# Patient Record
Sex: Male | Born: 1980
Health system: Southern US, Community
[De-identification: ages and names within clinical notes are randomized; demographics above are authoritative.]

## PROBLEM LIST (undated history)

## (undated) DIAGNOSIS — D860 Sarcoidosis of lung: Secondary | ICD-10-CM

## (undated) DIAGNOSIS — N200 Calculus of kidney: Secondary | ICD-10-CM

## (undated) HISTORY — PX: LIVER BIOPSY: SHX301

---

## 2013-10-18 DIAGNOSIS — Z0000 Encounter for general adult medical examination without abnormal findings: Secondary | ICD-10-CM

## 2013-10-18 HISTORY — DX: Encounter for general adult medical examination without abnormal findings: Z00.00

## 2013-11-07 DIAGNOSIS — D869 Sarcoidosis, unspecified: Secondary | ICD-10-CM

## 2013-11-07 DIAGNOSIS — R350 Frequency of micturition: Secondary | ICD-10-CM

## 2013-11-07 HISTORY — DX: Frequency of micturition: R35.0

## 2013-11-07 HISTORY — DX: Sarcoidosis, unspecified: D86.9

## 2014-10-21 DIAGNOSIS — L989 Disorder of the skin and subcutaneous tissue, unspecified: Secondary | ICD-10-CM

## 2014-10-21 DIAGNOSIS — F419 Anxiety disorder, unspecified: Secondary | ICD-10-CM | POA: Insufficient documentation

## 2014-10-21 HISTORY — DX: Disorder of the skin and subcutaneous tissue, unspecified: L98.9

## 2014-10-21 HISTORY — DX: Anxiety disorder, unspecified: F41.9

## 2015-04-29 DIAGNOSIS — D869 Sarcoidosis, unspecified: Secondary | ICD-10-CM | POA: Diagnosis not present

## 2015-04-29 DIAGNOSIS — Z7951 Long term (current) use of inhaled steroids: Secondary | ICD-10-CM | POA: Diagnosis not present

## 2015-04-29 DIAGNOSIS — Z6828 Body mass index (BMI) 28.0-28.9, adult: Secondary | ICD-10-CM | POA: Diagnosis not present

## 2015-05-15 DIAGNOSIS — K7689 Other specified diseases of liver: Secondary | ICD-10-CM | POA: Diagnosis not present

## 2015-05-15 DIAGNOSIS — N281 Cyst of kidney, acquired: Secondary | ICD-10-CM | POA: Diagnosis not present

## 2015-05-15 DIAGNOSIS — D869 Sarcoidosis, unspecified: Secondary | ICD-10-CM | POA: Diagnosis not present

## 2015-09-10 DIAGNOSIS — Z5181 Encounter for therapeutic drug level monitoring: Secondary | ICD-10-CM | POA: Diagnosis not present

## 2015-09-10 DIAGNOSIS — D8689 Sarcoidosis of other sites: Secondary | ICD-10-CM | POA: Diagnosis not present

## 2015-09-10 DIAGNOSIS — D869 Sarcoidosis, unspecified: Secondary | ICD-10-CM | POA: Diagnosis not present

## 2015-09-10 DIAGNOSIS — M2550 Pain in unspecified joint: Secondary | ICD-10-CM | POA: Diagnosis not present

## 2015-09-10 DIAGNOSIS — Z6826 Body mass index (BMI) 26.0-26.9, adult: Secondary | ICD-10-CM | POA: Diagnosis not present

## 2015-09-10 DIAGNOSIS — Z8489 Family history of other specified conditions: Secondary | ICD-10-CM | POA: Diagnosis not present

## 2015-09-10 DIAGNOSIS — D860 Sarcoidosis of lung: Secondary | ICD-10-CM | POA: Diagnosis not present

## 2015-09-10 DIAGNOSIS — Z79899 Other long term (current) drug therapy: Secondary | ICD-10-CM | POA: Diagnosis not present

## 2015-12-17 DIAGNOSIS — Z6827 Body mass index (BMI) 27.0-27.9, adult: Secondary | ICD-10-CM | POA: Diagnosis not present

## 2015-12-17 DIAGNOSIS — D869 Sarcoidosis, unspecified: Secondary | ICD-10-CM | POA: Diagnosis not present

## 2015-12-17 DIAGNOSIS — K759 Inflammatory liver disease, unspecified: Secondary | ICD-10-CM | POA: Diagnosis not present

## 2015-12-21 DIAGNOSIS — D869 Sarcoidosis, unspecified: Secondary | ICD-10-CM | POA: Diagnosis not present

## 2015-12-21 DIAGNOSIS — Z6828 Body mass index (BMI) 28.0-28.9, adult: Secondary | ICD-10-CM | POA: Diagnosis not present

## 2015-12-21 DIAGNOSIS — Z23 Encounter for immunization: Secondary | ICD-10-CM | POA: Diagnosis not present

## 2015-12-21 DIAGNOSIS — M2550 Pain in unspecified joint: Secondary | ICD-10-CM | POA: Diagnosis not present

## 2015-12-21 DIAGNOSIS — F419 Anxiety disorder, unspecified: Secondary | ICD-10-CM | POA: Diagnosis not present

## 2015-12-21 DIAGNOSIS — R5383 Other fatigue: Secondary | ICD-10-CM | POA: Diagnosis not present

## 2015-12-21 DIAGNOSIS — D8689 Sarcoidosis of other sites: Secondary | ICD-10-CM | POA: Diagnosis not present

## 2015-12-29 DIAGNOSIS — R7989 Other specified abnormal findings of blood chemistry: Secondary | ICD-10-CM | POA: Diagnosis not present

## 2015-12-29 DIAGNOSIS — D869 Sarcoidosis, unspecified: Secondary | ICD-10-CM | POA: Diagnosis not present

## 2015-12-29 DIAGNOSIS — R740 Nonspecific elevation of levels of transaminase and lactic acid dehydrogenase [LDH]: Secondary | ICD-10-CM | POA: Diagnosis not present

## 2015-12-29 DIAGNOSIS — R748 Abnormal levels of other serum enzymes: Secondary | ICD-10-CM | POA: Diagnosis not present

## 2016-05-14 ENCOUNTER — Encounter (HOSPITAL_COMMUNITY): Payer: Self-pay | Admitting: Emergency Medicine

## 2016-05-14 ENCOUNTER — Ambulatory Visit (HOSPITAL_COMMUNITY)
Admission: EM | Admit: 2016-05-14 | Discharge: 2016-05-14 | Disposition: A | Discharge status: Home / Self Care | Payer: BLUE CROSS/BLUE SHIELD | Attending: Internal Medicine | Admitting: Internal Medicine

## 2016-05-14 ENCOUNTER — Emergency Department (HOSPITAL_COMMUNITY)
Admission: EM | Admit: 2016-05-14 | Discharge: 2016-05-14 | Disposition: A | Discharge status: Home / Self Care | Payer: BLUE CROSS/BLUE SHIELD | Attending: Emergency Medicine | Admitting: Emergency Medicine

## 2016-05-14 ENCOUNTER — Encounter (HOSPITAL_COMMUNITY): Payer: Self-pay

## 2016-05-14 DIAGNOSIS — N200 Calculus of kidney: Secondary | ICD-10-CM | POA: Insufficient documentation

## 2016-05-14 DIAGNOSIS — R109 Unspecified abdominal pain: Secondary | ICD-10-CM | POA: Diagnosis not present

## 2016-05-14 DIAGNOSIS — R1011 Right upper quadrant pain: Secondary | ICD-10-CM

## 2016-05-14 DIAGNOSIS — D869 Sarcoidosis, unspecified: Secondary | ICD-10-CM

## 2016-05-14 HISTORY — DX: Sarcoidosis of lung: D86.0

## 2016-05-14 LAB — COMPREHENSIVE METABOLIC PANEL
ALT: 48 U/L (ref 17–63)
ANION GAP: 5 (ref 5–15)
AST: 38 U/L (ref 15–41)
Albumin: 3.8 g/dL (ref 3.5–5.0)
Alkaline Phosphatase: 122 U/L (ref 38–126)
BUN: 22 mg/dL — ABNORMAL HIGH (ref 6–20)
CHLORIDE: 101 mmol/L (ref 101–111)
CO2: 29 mmol/L (ref 22–32)
Calcium: 9.2 mg/dL (ref 8.9–10.3)
Creatinine, Ser: 1.37 mg/dL — ABNORMAL HIGH (ref 0.61–1.24)
GFR calc non Af Amer: >60 mL/min (ref >60)
Glucose, Bld: 81 mg/dL (ref 65–99)
POTASSIUM: 3.7 mmol/L (ref 3.5–5.1)
Sodium: 135 mmol/L (ref 135–145)
Total Bilirubin: 1 mg/dL (ref 0.3–1.2)
Total Protein: 8.2 g/dL — ABNORMAL HIGH (ref 6.5–8.1)

## 2016-05-14 LAB — CBC
HEMATOCRIT: 35.4 % — AB (ref 39.0–52.0)
HEMOGLOBIN: 12.4 g/dL — AB (ref 13.0–17.0)
MCH: 30.3 pg (ref 26.0–34.0)
MCHC: 35 g/dL (ref 30.0–36.0)
MCV: 86.6 fL (ref 78.0–100.0)
Platelets: 231 10*3/uL (ref 150–400)
RBC: 4.09 MIL/uL — AB (ref 4.22–5.81)
RDW: 12.6 % (ref 11.5–15.5)
WBC: 5.6 10*3/uL (ref 4.0–10.5)

## 2016-05-14 LAB — URINALYSIS, ROUTINE W REFLEX MICROSCOPIC
BILIRUBIN URINE: NEGATIVE
GLUCOSE, UA: NEGATIVE mg/dL
Hgb urine dipstick: NEGATIVE
Ketones, ur: NEGATIVE mg/dL
LEUKOCYTES UA: NEGATIVE
NITRITE: NEGATIVE
PH: 5 (ref 5.0–8.0)
Protein, ur: NEGATIVE mg/dL
SPECIFIC GRAVITY, URINE: 1.024 (ref 1.005–1.030)

## 2016-05-14 LAB — LIPASE, BLOOD: LIPASE: 30 U/L (ref 11–51)

## 2016-05-14 MED ORDER — HYDROCODONE-ACETAMINOPHEN 5-325 MG PO TABS: 1.0000 | ORAL_TABLET | Freq: Four times a day (QID) | ORAL | 0 refills | Status: DC | PRN

## 2016-05-14 MED ORDER — ONDANSETRON 4 MG PO TBDP: 4.0000 mg | ORAL_TABLET | Freq: Three times a day (TID) | ORAL | 0 refills | Status: DC | PRN

## 2016-05-14 NOTE — ED Provider Notes (Signed)
CSN: [id number]     Arrival date & time 05/14/16  1225 History   First MD Initiated Contact with Patient 05/14/16 1328     Chief Complaint  Patient presents with  . Abdominal Pain   (Consider location/radiation/quality/duration/timing/severity/associated sxs/prior Treatment) 36 year old male with a diagnosis sarcoidosis presents to the urgent care with a 3 day history of abdominal pain. He places his hand to the upper middle and right upper quadrant. He also states the pain radiates to the right lateral abdomen. He states it was relatively severe at the outset but a little better today. He has a decreased appetite. Denies nausea vomiting or diarrhea. Denies fever. He called his PCP today and her advise was to go to emergency department for evaluation that may include blood test and imaging.      History reviewed. No pertinent past medical history. History reviewed. No pertinent surgical history. History reviewed. No pertinent family history. Social History  Substance Use Topics  . Smoking status: Never Smoker  . Smokeless tobacco: Never Used  . Alcohol use No    Review of Systems  Constitutional: Positive for activity change. Negative for chills and fever.  Respiratory: Negative.   Cardiovascular: Negative.   Gastrointestinal: Positive for abdominal pain and diarrhea. Negative for blood in stool, constipation and vomiting.  Genitourinary: Negative.   Musculoskeletal: Negative.   Neurological: Negative.   All other systems reviewed and are negative.   Allergies  Patient has no known allergies.  Home Medications   Prior to Admission medications   Medication Sig Start Date End Date Taking? Authorizing Provider  Folic Acid 5 MG CAPS Take by mouth.   Yes [provider]  METHOTREXATE PO Take by mouth.   Yes [provider]   Meds Ordered and Administered this Visit  Medications - No data to display  BP 102/60 (BP Location: Right Arm)   Pulse 60   Temp  98.3 F (36.8 C) (Oral)   Resp 14   SpO2 98%  No data found.   Physical Exam  Constitutional: He is oriented to person, place, and time. He appears well-developed and well-nourished. No distress.  Eyes: EOM are normal.  Neck: Neck supple.  Cardiovascular: Normal rate, regular rhythm, normal heart sounds and intact distal pulses.   Pulmonary/Chest: Effort normal and breath sounds normal. No respiratory distress. He has no wheezes. He has no rales.  Abdominal: Soft. Bowel sounds are normal. There is tenderness. There is guarding. There is no rebound.  Epigastrium and upper abdomen with tympany. There is tenderness over the right upper quadrant with palpation of liver/GB with deep breath exacerbating tenderness. No tenderness across the lower abdomen. When legs are straight out there is a palpable nontender mass vertically from the left upper quadrant to the left lower quadrant just lateral to the umbilicus. No right lower quadrant tenderness or rebound.  Musculoskeletal: He exhibits no edema.  Neurological: He is alert and oriented to person, place, and time.  Skin: Skin is warm and dry.  Psychiatric: He has a normal mood and affect. His behavior is normal. Thought content normal.  Nursing note and vitals reviewed.   Urgent Care Course     Procedures (including critical care time)  Labs Review Labs Reviewed - No data to display  Imaging Review No results found.   Visual Acuity Review  Right Eye Distance:   Left Eye Distance:   Bilateral Distance:    Right Eye Near:   Left Eye Near:    Bilateral  Near:         MDM   1. Right upper quadrant abdominal pain   2. Acute right flank pain   3. Sarcoidosis    Go to the emergency department now for evaluation as recommended by your primary care provider. Unfortunately the types of lab work and imaging that you may need for evaluation today is not available at the urgent care.     Hayden Rasmussen, NP 05/14/16 1353    Hayden Rasmussen, NP 05/14/16 1354    Hayden Rasmussen, NP 05/14/16 1359

## 2016-05-14 NOTE — Discharge Instructions (Signed)
As discussed, please drink plenty of fluids to keep your urine clear. Nausea and pain medication as needed. Follow up with a primary care provider.  Return to the emergency department if you experience difficulty urinating, increased pain, fever, chills, nausea, vomiting or any other new concerning symptoms.

## 2016-05-14 NOTE — ED Triage Notes (Signed)
Here for abd pain onset 3 days associated w/back pain, decreased appetite, fatigue  Hx of sarcoidosis   Denies urinary sx, n/v/d, fevers, pain at the moment.   A&O x4... NAD

## 2016-05-14 NOTE — ED Triage Notes (Signed)
Patient complains of right sided abdominal pain with radiation to flank intermittent x 3 days. States that he has sarcoidosis and recently had biopsy and liver enzymes were elevated. Here for further eval. No nausea, no vomiting, no diarrhea

## 2016-05-14 NOTE — ED Provider Notes (Signed)
[facility]-EMERGENCY DEPT Provider Note   CSN: [id number] Arrival date & time: 05/14/16  1411     History   Chief Complaint Chief Complaint  Patient presents with  . Abdominal Pain    HPI Jeffery Yates is a 36 y.o. male with a history of sarcoidosis presenting with 3 days of intermittent non-radiating sharp right flank pain which was initially worse and now is less in intensity. He denies any pain on my assessment. He has had a little bit of nausea with the intense pain initially but not at this time. He has not noted any alleviating or worsening factors. Has not tried anything for the pain. Denies vomiting, diarrhea, fever, chills, radiation of the pain down in his groin, no urinary symptoms, no testicular pain or swelling. He does note a history of kidney stones and reports that the symptoms are similar.  HPI  Past Medical History:  Diagnosis Date  . Sarcoidosis, lung (HCC)     There are no active problems to display for this patient.   History reviewed. No pertinent surgical history.     Home Medications    Prior to Admission medications   Medication Sig Start Date End Date Taking? Authorizing Provider  folic acid (FOLVITE) 1 MG tablet Take 1 mg by mouth daily. 04/05/16  Yes [provider]  TREXALL 15 MG tablet Take 15 mg by mouth every 7 (seven) days. 04/21/16  Yes [provider]  HYDROcodone-acetaminophen (NORCO/VICODIN) 5-325 MG tablet Take 1 tablet by mouth every 6 (six) hours as needed. 05/14/16   Mathews Robinsons B, PA-C  ondansetron (ZOFRAN ODT) 4 MG disintegrating tablet Take 1 tablet (4 mg total) by mouth every 8 (eight) hours as needed for nausea or vomiting. 05/14/16   Georgiana Shore, PA-C    Family History No family history on file.  Social History Social History  Substance Use Topics  . Smoking status: Never Smoker  . Smokeless tobacco: Never Used  . Alcohol use No     Allergies   Patient has no known allergies.   Review of  Systems Review of Systems  Constitutional: Negative for chills and fever.  HENT: Negative for ear pain and sore throat.   Eyes: Negative for pain and visual disturbance.  Respiratory: Negative for cough, shortness of breath, wheezing and stridor.   Cardiovascular: Negative for chest pain, palpitations and leg swelling.  Gastrointestinal: Positive for nausea. Negative for abdominal distention, abdominal pain, diarrhea and vomiting.  Genitourinary: Positive for flank pain. Negative for decreased urine volume, difficulty urinating, dysuria, frequency, hematuria, scrotal swelling, testicular pain and urgency.  Musculoskeletal: Negative for arthralgias, back pain, myalgias, neck pain and neck stiffness.  Skin: Negative for color change, pallor and rash.  Neurological: Negative for seizures, syncope and weakness.     Physical Exam Updated Vital Signs BP 128/72   Pulse (!) 58   Temp 97.5 F (36.4 C) (Oral)   Resp 17   Ht 5\' 8"  (1.727 m)   Wt 85.5 kg   SpO2 100%   BMI 28.65 kg/m   Physical Exam  Constitutional: He appears well-developed and well-nourished. No distress.  Patient is afebrile, nontoxic-appearing, lying comfortably in bed in no acute distress.  HENT:  Head: Normocephalic and atraumatic.  Eyes: Conjunctivae and EOM are normal. Right eye exhibits no discharge. Left eye exhibits no discharge. No scleral icterus.  Neck: Normal range of motion. Neck supple.  Cardiovascular: Normal rate, regular rhythm and normal heart sounds.   No murmur heard. Pulmonary/Chest:  Effort normal and breath sounds normal. No respiratory distress. He has no wheezes. He has no rales. He exhibits no tenderness.  Abdominal: Soft. Bowel sounds are normal. He exhibits no distension and no mass. There is no tenderness. There is no guarding.  Patient has no tenderness palpation of the abdomen in the area of discomfort as nonreproducible to palpation. Negative Murphy sign, negative McBurney's point  tenderness, negative rebound, negative periumbilical tenderness, abdomen is soft and nontender.  Musculoskeletal: Normal range of motion. He exhibits no edema.  Neurological: He is alert.  Skin: Skin is warm and dry. He is not diaphoretic.  Psychiatric: He has a normal mood and affect.  Nursing note and vitals reviewed.    ED Treatments / Results  Labs (all labs ordered are listed, but only abnormal results are displayed) Labs Reviewed  COMPREHENSIVE METABOLIC PANEL - Abnormal; Notable for the following:       Result Value   BUN 22 (*)    Creatinine, Ser 1.37 (*)    Total Protein 8.2 (*)    All other components within normal limits  CBC - Abnormal; Notable for the following:    RBC 4.09 (*)    Hemoglobin 12.4 (*)    HCT 35.4 (*)    All other components within normal limits  LIPASE, BLOOD  URINALYSIS, ROUTINE W REFLEX MICROSCOPIC    EKG  EKG Interpretation None       Radiology No results found.  Procedures Procedures (including critical care time)  Medications Ordered in ED Medications - No data to display   Initial Impression / Assessment and Plan / ED Course  I have reviewed the triage vital signs and the nursing notes.  Pertinent labs & imaging results that were available during my care of the patient were reviewed by me and considered in my medical decision making (see chart for details).     Patient presents with intermittent right flank pain which has been decreasing in intensity over the last couple days. He declines anything for pain at this time. No tenderness palpation on my exam. He is afebrile nontoxic appearing.  Patient with history of kidney stone and reports that the symptoms are similar. His records show a CT renal few years ago with a right nonobstructing stone. Exam is otherwise reassuring labs are unremarkable other than the slightly elevated creatinine. Past record show creatinine around 1.22 and is now 1.37. Negative urine. Liver functions  are normal.  Discussed with patient clinical suspicion of kidney stone. Use joint decision making in ordering CT scan today.  Will treat as kidney stone with return precautions and close follow-up.   We'll discharge home with pain management and close follow-up with PCP or urology.   Discussed strict return precautions and advised to return to the emergency department if experiencing any new or worsening symptoms. Instructions were understood and patient agreed with discharge plan.Patient was discussed with Dr. Effie ShyWentz who agrees with assessment and plan.  Final Clinical Impressions(s) / ED Diagnoses   Final diagnoses:  Flank pain, acute  Nephrolithiasis    New Prescriptions Discharge Medication List as of 05/14/2016  5:14 PM    START taking these medications   Details  HYDROcodone-acetaminophen (NORCO/VICODIN) 5-325 MG tablet Take 1 tablet by mouth every 6 (six) hours as needed., Starting Sat 05/14/2016, Print    ondansetron (ZOFRAN ODT) 4 MG disintegrating tablet Take 1 tablet (4 mg total) by mouth every 8 (eight) hours as needed for nausea or vomiting., Starting Sat 05/14/2016, Print  Georgiana Shore, PA-C 05/14/16 1843    Mancel Bale, MD 05/18/16 [phone]

## 2016-05-14 NOTE — Discharge Instructions (Signed)
Go to the emergency department now for evaluation as recommended by your primary care provider. Unfortunately the types of lab work and imaging that you may need for evaluation today is not available at the urgent care.

## 2016-05-14 NOTE — ED Notes (Signed)
Patient given fluids 

## 2016-06-20 DIAGNOSIS — F419 Anxiety disorder, unspecified: Secondary | ICD-10-CM | POA: Diagnosis not present

## 2016-06-20 DIAGNOSIS — D860 Sarcoidosis of lung: Secondary | ICD-10-CM | POA: Diagnosis not present

## 2016-06-20 DIAGNOSIS — Z79899 Other long term (current) drug therapy: Secondary | ICD-10-CM | POA: Diagnosis not present

## 2016-06-20 DIAGNOSIS — M2550 Pain in unspecified joint: Secondary | ICD-10-CM | POA: Diagnosis not present

## 2016-06-20 DIAGNOSIS — R05 Cough: Secondary | ICD-10-CM | POA: Diagnosis not present

## 2016-06-20 DIAGNOSIS — Z6828 Body mass index (BMI) 28.0-28.9, adult: Secondary | ICD-10-CM | POA: Diagnosis not present

## 2016-06-20 DIAGNOSIS — D869 Sarcoidosis, unspecified: Secondary | ICD-10-CM | POA: Diagnosis not present

## 2016-06-20 DIAGNOSIS — R5383 Other fatigue: Secondary | ICD-10-CM | POA: Diagnosis not present

## 2016-08-11 ENCOUNTER — Encounter: Payer: Self-pay | Admitting: Internal Medicine

## 2016-08-11 ENCOUNTER — Ambulatory Visit (INDEPENDENT_AMBULATORY_CARE_PROVIDER_SITE_OTHER): Payer: BLUE CROSS/BLUE SHIELD | Admitting: Internal Medicine

## 2016-08-11 ENCOUNTER — Ambulatory Visit (INDEPENDENT_AMBULATORY_CARE_PROVIDER_SITE_OTHER)
Admission: RE | Admit: 2016-08-11 | Discharge: 2016-08-11 | Disposition: A | Discharge status: Home / Self Care | Payer: BLUE CROSS/BLUE SHIELD | Source: Ambulatory Visit | Attending: Internal Medicine | Admitting: Internal Medicine

## 2016-08-11 VITALS — BP 106/70 | HR 55 | Ht 68.0 in | Wt 187.0 lb

## 2016-08-11 DIAGNOSIS — D869 Sarcoidosis, unspecified: Secondary | ICD-10-CM

## 2016-08-11 DIAGNOSIS — J849 Interstitial pulmonary disease, unspecified: Secondary | ICD-10-CM | POA: Diagnosis not present

## 2016-08-11 NOTE — Progress Notes (Signed)
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 Pulmonary Consult    Referred by Dr. Okey Regalrummond. Pt here to est care for his Sarcoid. He was dxed with Sarcoid in 2003. He states his main problem is fatigue. He has a cough every am with clear to yellow sputum. He states several wks ago he had a cough that was keeping him awake but this went away.   uses saba and a lot better p saba but still poor ex tol = MMRC1 = can walk nl pace, flat grade, can't hurry or go uphills or steps s sob    No obvious day to day or daytime variability or presently  assoc excess/ purulent sputum or mucus plugs or hemoptysis or cp or chest tightness, subjective wheeze or overt sinus or hb symptoms. No unusual exp hx or h/o childhood pna/ asthma or knowledge of premature birth.  Sleeping ok without nocturnal   exacerbation  of respiratory  c/o's or need for noct saba. Also denies any obvious fluctuation of symptoms with weather or environmental changes or other aggravating or alleviating factors except as outlined above   Current Medications, Allergies, Complete Past Medical History, Past Surgical History, Family History, and Social History were reviewed in [facility] electronic medical record.  ROS  The following are not active complaints unless bolded sore throat, dysphagia, dental problems,  itching, sneezing,  nasal congestion or excess/ purulent secretions, ear ache,   fever, chills, sweats, unintended wt loss, classically pleuritic or exertional cp,  orthopnea pnd or leg swelling, presyncope, palpitations, abdominal pain, anorexia, nausea, vomiting, diarrhea  or change in bowel or bladder habits, change in stools or urine, dysuria,hematuria,  rash, arthralgias, visual complaints, headache, numbness, weakness or ataxia or problems with walking or coordination,  change in mood/affect or memory.           Review of Systems     Objective:   Physical Exam    amb bm nad    Wt Readings from Last 3 Encounters:  08/11/16 187 lb (84.8 kg)  05/14/16 188 lb 6.4 oz (85.5 kg)    Vital signs reviewed - Note on arrival 02 sats  98% on RA      HEENT: nl dentition, turbinates bilaterally, and oropharynx. Nl external ear canals without cough reflex   NECK :  without JVD/Nodes/TM/ nl carotid upstrokes bilaterally   LUNGS: no acc muscle use,  Nl contour chest which is clear to A and P bilaterally without cough on insp or exp maneuvers   CV:  RRR  no s3 or murmur or increase in P2, and no edema   ABD:  soft and nontender with nl inspiratory excursion in the supine position. No bruits or organomegaly appreciated, bowel sounds nl  MS:  Nl gait/ ext  warm without deformities, calf tenderness, cyanosis or clubbing No obvious joint restrictions   SKIN: warm and dry without lesions    NEURO:  alert, approp, nl sensorium with  no motor or cerebellar deficits apparent.      CXR PA and Lateral:   08/11/2016 :    I personally reviewed images and agree with radiology impression as follows:    Bilateral interstitial prominence noted consistent chronic interstitial lung disease.   Labs   reviewed:      Chemistry      Component Value Date/Time   NA 135 05/14/2016 1420   K 3.7 05/14/2016 1420   CL 101 05/14/2016 1420   CO2 29 05/14/2016 1420   BUN 22 (H) 05/14/2016 1420    CREATININE 1.37 (H) 05/14/2016 1420      Component Value Date/Time   CALCIUM 9.2 05/14/2016 1420   ALKPHOS 122 05/14/2016 1420   AST 38 05/14/2016 1420   ALT 48 05/14/2016 1420   BILITOT 1.0 05/14/2016 1420        Lab Results  Component Value Date   WBC 5.6 05/14/2016   HGB 12.4 (L) 05/14/2016   HCT 35.4 (L) 05/14/2016   MCV 86.6 05/14/2016   PLT 231 05/14/2016            Assessment:

## 2016-08-11 NOTE — Patient Instructions (Addendum)
Please remember to go to the  x-ray department downstairs in the basement  for your tests - we will call you with the results when they are available.  To get the most out of exercise, you need to be continuously aware that you are short of breath, but never out of breath, for 30 minutes daily. As you improve, it will actually be easier for you to do the same amount of exercise  in  30 minutes so always push to the level where you are short of breath.   Please see patient coordinator before you leave today  to schedule ophthalmology eval     Please schedule a follow up office visit in 6 weeks, call sooner if needed with pfts on return

## 2016-08-11 NOTE — Progress Notes (Signed)
Called and left detailed msg with results

## 2016-08-11 NOTE — Assessment & Plan Note (Addendum)
Dx 2003 [place] dx by [facility]OB intolerant to high dose steroids/ main symptoms = fatigue/ doe/ min cough  - 04/25/13  FVC 4.16 = 101%  - 04/30/15  FVC 4.2 = 107% - Spirometry 05/24/16   FEV1 1.15  (40%)  Ratio 73 with non-physiologic f/v loop  With FVC 1.58   - 08/11/2016 1st [facility] on MTX 15 mg per week and referred to opth in [place]    Not really certain all his symptoms can be attributed to 15 year hx of steroid /mtx dependent inflammation and may need to get more serial objective data to justify this aggressive of rx   For now no change in rx needed but needs to start more regular aerobic activity and continue mtx and return to clinic in 6 weeks with full pfts and repeat lfts/ esr / angiotensin levels then   Total time devoted to counseling  > 50 % of initial 60 min office visit:  review case(and extensive [facility] records)  with pt/ discussion of options/alternatives/ personally creating written customized instructions  in presence of pt  then going over those specific  Instructions directly with the pt including how to use all of the meds but in particular covering each new medication in detail and the difference between the maintenance= "automatic" meds and the prns using an action plan format for the latter (If this problem/symptom => do that organization reading Left to right).  Please see AVS from this visit for a full list of these instructions which I personally wrote for this pt and  are unique to this visit.

## 2016-08-18 DIAGNOSIS — D869 Sarcoidosis, unspecified: Secondary | ICD-10-CM | POA: Diagnosis not present

## 2016-08-18 DIAGNOSIS — H04123 Dry eye syndrome of bilateral lacrimal glands: Secondary | ICD-10-CM | POA: Diagnosis not present

## 2016-10-02 DIAGNOSIS — R000 Tachycardia, unspecified: Secondary | ICD-10-CM | POA: Diagnosis not present

## 2016-10-02 DIAGNOSIS — Z8679 Personal history of other diseases of the circulatory system: Secondary | ICD-10-CM | POA: Diagnosis not present

## 2016-10-02 DIAGNOSIS — R0789 Other chest pain: Secondary | ICD-10-CM | POA: Diagnosis not present

## 2016-10-02 DIAGNOSIS — I447 Left bundle-branch block, unspecified: Secondary | ICD-10-CM | POA: Diagnosis not present

## 2016-10-05 ENCOUNTER — Other Ambulatory Visit: Payer: Self-pay | Admitting: Internal Medicine

## 2016-10-05 DIAGNOSIS — R0600 Dyspnea, unspecified: Secondary | ICD-10-CM

## 2016-10-06 ENCOUNTER — Ambulatory Visit: Payer: BLUE CROSS/BLUE SHIELD | Admitting: Internal Medicine

## 2016-11-12 ENCOUNTER — Emergency Department (HOSPITAL_COMMUNITY)
Admission: EM | Admit: 2016-11-12 | Discharge: 2016-11-13 | Disposition: A | Discharge status: Home / Self Care | Payer: BLUE CROSS/BLUE SHIELD | Attending: Emergency Medicine | Admitting: Emergency Medicine

## 2016-11-12 ENCOUNTER — Encounter (HOSPITAL_COMMUNITY): Payer: Self-pay

## 2016-11-12 DIAGNOSIS — R21 Rash and other nonspecific skin eruption: Secondary | ICD-10-CM | POA: Insufficient documentation

## 2016-11-12 DIAGNOSIS — Z79899 Other long term (current) drug therapy: Secondary | ICD-10-CM | POA: Diagnosis not present

## 2016-11-12 DIAGNOSIS — D860 Sarcoidosis of lung: Secondary | ICD-10-CM | POA: Diagnosis not present

## 2016-11-12 MED ORDER — PREDNISONE 20 MG PO TABS: 40.0000 mg | ORAL_TABLET | Freq: Every day | ORAL | 0 refills | Status: DC

## 2016-11-12 MED ORDER — FAMOTIDINE 20 MG PO TABS: 20.0000 mg | ORAL_TABLET | Freq: Two times a day (BID) | ORAL | 0 refills | Status: DC

## 2016-11-12 MED ORDER — PREDNISONE 20 MG PO TABS
60.0000 mg | ORAL_TABLET | Freq: Once | ORAL | Status: AC
  Administered 2016-11-13: 60 mg via ORAL
  Filled 2016-11-12: qty 3

## 2016-11-12 MED ORDER — FAMOTIDINE 20 MG PO TABS
20.0000 mg | ORAL_TABLET | Freq: Once | ORAL | Status: AC
  Administered 2016-11-13: 20 mg via ORAL
  Filled 2016-11-12: qty 1

## 2016-11-12 NOTE — ED Triage Notes (Signed)
Onset 2 nights ago widespread red raised rash, itches.  Rash will come and go.  Has been taking benadryl with some relief in itching.  No new foods, products, medications.  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  NO swallowing or respiratory difficulties.

## 2016-11-12 NOTE — Discharge Instructions (Signed)
Continue Benadryl, take every 6 hours.  Take prednisone as prescribed until all gone.  Take Pepcid daily to help with the reaction.  Follow-up with family doctor or dermatology.  Return if any swelling to the lips, tongue, oropharynx.

## 2016-11-12 NOTE — ED Provider Notes (Signed)
[facility] EMERGENCY DEPARTMENT Provider Note   CSN: [id number] Arrival date & time: 11/12/16  2110     History   Chief Complaint Chief Complaint  Patient presents with  . Rash    HPI Jeffery Yates is a 36 y.o. male.  HPI Jeffery Yates is a 36 y.o. male presents to emergency department complaining of rash.  Patient states he noticed rash 2 days ago.  Rash comes and goes.  States it is "all over my body."  Rash is erythematous, very itchy.  He states Benadryl helps the rash.  Last dose taken at 6 PM, and currently no rash other than some itching and bumps to the hands.  He denies any associated shortness of breath, swelling to the lips, tongue, oropharynx.  He denies any new products, specifically no body lotions, soaps, detergents.  He denies any new medications.  No new clothing.  He has not spent a night anywhere else within his house.  He states he did clean out old house before symptoms began.  No one in the household with similar rash.  Past Medical History:  Diagnosis Date  . Sarcoidosis, lung [facility])     Patient Active Problem List   Diagnosis Date Noted  . Sarcoidosis 08/11/2016    History reviewed. No pertinent surgical history.     Home Medications    Prior to Admission medications   Medication Sig Start Date End Date Taking? Authorizing Provider  folic acid (FOLVITE) 1 MG tablet Take 1 mg by mouth daily. 04/05/16   [provider]  TREXALL 15 MG tablet Take 15 mg by mouth every 7 (seven) days. 04/21/16   [provider]    Family History History reviewed. No pertinent family history.  Social History Social History  Substance Use Topics  . Smoking status: Never Smoker  . Smokeless tobacco: Never Used  . Alcohol use No     Allergies   Patient has no known allergies.   Review of Systems Review of Systems  Constitutional: Negative for chills and fever.  HENT: Negative for congestion, facial swelling, trouble  swallowing and voice change.   Respiratory: Negative for cough, chest tightness and shortness of breath.   Cardiovascular: Negative for chest pain, palpitations and leg swelling.  Gastrointestinal: Negative for abdominal distention, abdominal pain, diarrhea and nausea.  Musculoskeletal: Negative for arthralgias, myalgias, neck pain and neck stiffness.  Skin: Positive for rash.  Allergic/Immunologic: Negative for immunocompromised state.  Neurological: Negative for dizziness, weakness, light-headedness, numbness and headaches.  All other systems reviewed and are negative.    Physical Exam Updated Vital Signs BP 118/82 (BP Location: Left Arm)   Pulse 66   Temp 97.9 F (36.6 C) (Oral)   Resp 16   Ht 5\' 8"  (1.727 m)   Wt 83.9 kg (185 lb)   SpO2 100%   BMI 28.13 kg/m   Physical Exam  Constitutional: He appears well-developed and well-nourished. No distress.  HENT:  Head: Normocephalic and atraumatic.  Right Ear: External ear normal.  Left Ear: External ear normal.  Nose: Nose normal.  Mouth/Throat: Oropharynx is clear and moist.  Eyes: Conjunctivae are normal.  Neck: Neck supple.  Cardiovascular: Normal rate, regular rhythm and normal heart sounds.   Pulmonary/Chest: Effort normal. No respiratory distress. He has no wheezes. He has no rales.  Musculoskeletal: He exhibits no edema.  Neurological: He is alert.  Skin: Skin is warm and dry.  Mild erythema in patches to bilateral hands and fingers.  No vesicles or papules.  Small hives to bilateral groin.  No rash anywhere else to the body.  No rash of oral mucosa  Nursing note and vitals reviewed.    ED Treatments / Results  Labs (all labs ordered are listed, but only abnormal results are displayed) Labs Reviewed - No data to display  EKG  EKG Interpretation None       Radiology No results found.  Procedures Procedures (including critical care time)  Medications Ordered in ED Medications  predniSONE (DELTASONE)  tablet 60 mg (not administered)  famotidine (PEPCID) tablet 20 mg (not administered)     Initial Impression / Assessment and Plan / ED Course  I have reviewed the triage vital signs and the nursing notes.  Pertinent labs & imaging results that were available during my care of the patient were reviewed by me and considered in my medical decision making (see chart for details).     Patient with hives that come and go.  Currently only a few small hives to bilateral hands and fingers and groin.  Will treat with steroids, put on prednisone for 5 days.  Instructed to follow-up with a family doctor or dermatologist if symptoms persist.  At this time no mucosal involvement.  No difficulty breathing.  No swelling to the lips, tongue, oropharynx.  Vital signs are normal.  No concern for anaphylaxis or Stevens-Johnson syndrome.  He is stable for discharge home. First dose of meds given in ED>   Vitals:   11/12/16 2114  BP: 118/82  Pulse: 66  Resp: 16  Temp: 97.9 F (36.6 C)  TempSrc: Oral  SpO2: 100%  Weight: 83.9 kg (185 lb)  Height: 5\' 8"  (1.727 m)    Final Clinical Impressions(s) / ED Diagnoses   Final diagnoses:  Rash    New Prescriptions New Prescriptions   FAMOTIDINE (PEPCID) 20 MG TABLET    Take 1 tablet (20 mg total) by mouth 2 (two) times daily.   PREDNISONE (DELTASONE) 20 MG TABLET    Take 2 tablets (40 mg total) by mouth daily.     Jaynie CrumbleKirichenko, Stephnie Parlier, PA-C 11/12/16 [phone]    Shaune PollackIsaacs, Cameron, MD 11/13/16 [phone]

## 2016-11-13 NOTE — ED Notes (Addendum)
Provided graham crackers and peanut butter to have with administered medications. Patient verbalized understanding of discharge instructions and denies any further needs or questions at this time. VS stable. Patient ambulatory with steady gait.

## 2016-11-29 ENCOUNTER — Telehealth: Payer: Self-pay | Admitting: Internal Medicine

## 2016-11-29 NOTE — Telephone Encounter (Signed)
Fine with me - note did not follow up as req from initial ov ? Why?  - would do a trust but verify approach here and insist he brings all meds to all ov's or consider [facility]

## 2016-11-29 NOTE — Telephone Encounter (Signed)
PM ok to switch to you for evaluation of sarcoidosis?

## 2016-11-29 NOTE — Telephone Encounter (Signed)
Pt is wanting to switch providers in the office. He saw MW on 08/11/16 for Sarcoidosis.  MW - please advise if you are okay with the pt switching providers. Thanks.

## 2016-11-29 NOTE — Telephone Encounter (Signed)
OK with me.

## 2016-11-30 NOTE — Telephone Encounter (Signed)
Spoke with pt advised PM would take over his care. He was on in the car and will call back to make appt. Nothing further is needed.

## 2016-12-15 ENCOUNTER — Ambulatory Visit: Payer: BLUE CROSS/BLUE SHIELD | Admitting: Pulmonary Disease

## 2016-12-15 ENCOUNTER — Encounter: Payer: Self-pay | Admitting: Pulmonary Disease

## 2016-12-15 ENCOUNTER — Other Ambulatory Visit (INDEPENDENT_AMBULATORY_CARE_PROVIDER_SITE_OTHER): Payer: BLUE CROSS/BLUE SHIELD

## 2016-12-15 VITALS — BP 118/74 | HR 78 | Ht 68.0 in | Wt 196.8 lb

## 2016-12-15 DIAGNOSIS — Z23 Encounter for immunization: Secondary | ICD-10-CM

## 2016-12-15 DIAGNOSIS — D869 Sarcoidosis, unspecified: Secondary | ICD-10-CM

## 2016-12-15 DIAGNOSIS — G4719 Other hypersomnia: Secondary | ICD-10-CM

## 2016-12-15 LAB — COMPREHENSIVE METABOLIC PANEL
ALBUMIN: 4.2 g/dL (ref 3.5–5.2)
ALK PHOS: 99 U/L (ref 39–117)
ALT: 34 U/L (ref 0–53)
AST: 26 U/L (ref 0–37)
BILIRUBIN TOTAL: 1 mg/dL (ref 0.2–1.2)
BUN: 19 mg/dL (ref 6–23)
CALCIUM: 9.2 mg/dL (ref 8.4–10.5)
CHLORIDE: 105 meq/L (ref 96–112)
CO2: 28 mEq/L (ref 19–32)
CREATININE: 1.15 mg/dL (ref 0.40–1.50)
GFR: 91.15 mL/min (ref >60.00)
Glucose, Bld: 83 mg/dL (ref 70–99)
Potassium: 4.2 mEq/L (ref 3.5–5.1)
SODIUM: 139 meq/L (ref 135–145)
TOTAL PROTEIN: 7.9 g/dL (ref 6.0–8.3)

## 2016-12-15 LAB — CBC WITH DIFFERENTIAL/PLATELET
BASOS ABS: 0.1 10*3/uL (ref 0.0–0.1)
Basophils Relative: 1.9 % (ref 0.0–3.0)
EOS ABS: 0.3 10*3/uL (ref 0.0–0.7)
Eosinophils Relative: 4.6 % (ref 0.0–5.0)
HEMATOCRIT: 39.1 % (ref 39.0–52.0)
HEMOGLOBIN: 13.5 g/dL (ref 13.0–17.0)
LYMPHS PCT: 28.2 % (ref 12.0–46.0)
Lymphs Abs: 1.7 10*3/uL (ref 0.7–4.0)
MCHC: 34.7 g/dL (ref 30.0–36.0)
MCV: 89.9 fl (ref 78.0–100.0)
MONO ABS: 0.7 10*3/uL (ref 0.1–1.0)
Monocytes Relative: 11.2 % (ref 3.0–12.0)
Neutro Abs: 3.2 10*3/uL (ref 1.4–7.7)
Neutrophils Relative %: 54.1 % (ref 43.0–77.0)
Platelets: 219 10*3/uL (ref 150.0–400.0)
RBC: 4.35 Mil/uL (ref 4.22–5.81)
RDW: 12.5 % (ref 11.5–15.5)
WBC: 5.9 10*3/uL (ref 4.0–10.5)

## 2016-12-15 LAB — C-REACTIVE PROTEIN: CRP: 0.4 mg/dL — ABNORMAL LOW (ref 0.5–20.0)

## 2016-12-15 LAB — SEDIMENTATION RATE: Sed Rate: 4 mm/hr (ref 0–15)

## 2016-12-15 LAB — CALCIUM: CALCIUM: 9.2 mg/dL (ref 8.4–10.5)

## 2016-12-15 MED ORDER — TREXALL 15 MG PO TABS: 15.0000 mg | ORAL_TABLET | ORAL | 1 refills | Status: DC

## 2016-12-15 MED ORDER — FOLIC ACID 1 MG PO TABS: 1.0000 mg | ORAL_TABLET | Freq: Every day | ORAL | 2 refills | Status: DC

## 2016-12-15 NOTE — Patient Instructions (Addendum)
We will renew your prescription for methotrexate and folic acid We will schedule you for a high-resolution CT, pulmonary function test Check labs today including comprehensive metabolic profile, CBC with differential, angiotensin-converting enzyme, sed rate, CRP, ANA with reflex, rheumatoid factor, CCP, serum and urine calcium levels, EKG We will refer you to ophthalmology for examination of your eye to rule out involvement by sarcoid We will schedule you for a split-night sleep study Follow-up in 3 months.

## 2016-12-15 NOTE — Progress Notes (Addendum)
Jeffery Yates    [id number]    04/17/1980  Primary Care Physician:Patient, No Pcp Per  Referring Physician: No referring provider defined for this encounter.  Chief complaint: Consult for sarcoidosis  HPI: 36 year old with sarcoidosis. He was initially diagnosed around 2005 in [place] and found to have stomach involvement of sarcoidosis on EGD with biopsy.  He was previously followed at [facility].  As per notes he has involvement of stomach, liver, pulmonary.  He underwent a liver biopsy in December 2017 which showed hepatic sarcoid.  Treated initially with prednisone which had to be stopped due to side effects.  He has been maintained on methotrexate and folic acid.  [facility] [place] [facility] [place]. Chief complaint is chronic fatigue which is unchanged from baseline.  He has joint pain and stiffness.  Symptoms of snoring at night.  Denies daytime sleepiness.  Reports having a kidney stone earlier this year.  Occupation: Wellsite geologistArt teacher at [employer] Smoking history: Never smoker Travel History: Not significant  Outpatient Encounter Medications as of 12/15/2016  Medication Sig  . folic acid (FOLVITE) 1 MG tablet Take 1 mg by mouth daily.  Marland Kitchen. TREXALL 15 MG tablet Take 15 mg by mouth every 7 (seven) days.  . [DISCONTINUED] predniSONE (DELTASONE) 20 MG tablet Take 2 tablets (40 mg total) by mouth daily.  . [DISCONTINUED] famotidine (PEPCID) 20 MG tablet Take 1 tablet (20 mg total) by mouth 2 (two) times daily. (Patient not taking: Reported on 12/15/2016)   No facility-administered encounter medications on file as of 12/15/2016.     Allergies as of 12/15/2016  . (No Known Allergies)    Past Medical History:  Diagnosis Date  . Sarcoidosis, lung (HCC)     No past surgical history on file.  No family history on file.  Social History   Socioeconomic History  . Marital status: Single    Spouse name: Not on file  .  Number of children: Not on file  . Years of education: Not on file  . Highest education level: Not on file  Social Needs  . Financial resource strain: Not on file  . Food insecurity - worry: Not on file  . Food insecurity - inability: Not on file  . Transportation needs - medical: Not on file  . Transportation needs - non-medical: Not on file  Occupational History  . Not on file  Tobacco Use  . Smoking status: Never Smoker  . Smokeless tobacco: Never Used  Substance and Sexual Activity  . Alcohol use: No  . Drug use: No  . Sexual activity: Not on file  Other Topics Concern  . Not on file  Social History Narrative  . Not on file    Review of systems: Review of Systems  Constitutional: Negative for fever and chills.  HENT: Negative.   Eyes: Negative for blurred vision.  Respiratory: as per HPI  Cardiovascular: Negative for chest pain and palpitations.  Gastrointestinal: Negative for vomiting, diarrhea, blood per rectum. Genitourinary: Negative for dysuria, urgency, frequency and hematuria.  Musculoskeletal: Negative for myalgias, back pain and joint pain.  Skin: Negative for itching and rash.  Neurological: Negative for dizziness, tremors, focal weakness, seizures and loss of consciousness.  Endo/Heme/Allergies: Negative for environmental allergies.  Psychiatric/Behavioral: Negative for depression, suicidal ideas and hallucinations.  All other systems reviewed and are negative.  Physical Exam: Blood pressure 118/74, pulse 78, height 5\' 8"  (1.727 m), weight  196 lb 12.8 oz (89.3 kg), SpO2 98 %. Gen:      No acute distress HEENT:  EOMI, sclera anicteric Neck:     No masses; no thyromegaly Lungs:    Clear to auscultation bilaterally; normal respiratory effort CV:         Regular rate and rhythm; no murmurs Abd:      + bowel sounds; soft, non-tender; no palpable masses, no distension Ext:    No edema; adequate peripheral perfusion Skin:      Warm and dry; no rash Neuro:  alert and oriented x 3 Psych: normal mood and affect  Data Reviewed: Chest x-ray 08/11/16- Bilateral interstitial opacities consistent with sarcoid.  I have reviewed the images of personally.   Assessment:  Sarcoidosis with multisystem involvement Has involvement of joints, liver and lungs.  Maintained on methotrexate for many years with stable symptoms He will need reassessment since he is [facility] Check high-resolution CT and PFTs Blood test including liver enzymes, metabolic panel, CBC, ACE levels, calcium levels, ANA, rheumatoid factor, CCP Refer to ophthalmology for annual examination.  Refer to rheumatology for monitoring of joint inflammation Continue methotrexate and folic acid He will need an EKG for baseline assessment.  As he had to leave early today we will get this at next visit.  Suspected sleep apnea Untreated sleep apnea may be contributing to his ongoing fatigue.  Schedule for sleep study.  Plan/Recommendations: -Continue methotrexate, folic acid -Blood tests for monitoring methotrexate therapy -High res CT, PFTs -Referral to rheumatology, ophthalmology -Sleep study  Chilton GreathousePraveen Kortney Schoenfelder MD [facility] 12/15/2016, 9:17 AM  CC: No ref. provider found   Addendum: Reviewed notes from Dr. Nickola MajorHawkes, rheumatology Seen on 03/13/2017 Notes that he is doing well on methotrexate although he has had fatigue and muscle ache He may benefit from a higher dose of methotrexate.  If he continues to have flares then the dose.  Checking CK, aldolase, sed rate, CMP, hepatitis panel, QuantiFERON.  Chilton GreathousePraveen Kaemon Barnett MD [facility] 05/11/2017, 1:43 PM

## 2016-12-16 LAB — ANA W/REFLEX: Anti Nuclear Antibody(ANA): NEGATIVE

## 2016-12-16 LAB — CALCIUM, URINE, RANDOM: CALCIUM, RANDOM URINE: 5.5 mg/dL

## 2016-12-16 LAB — RHEUMATOID FACTOR: Rhuematoid fact SerPl-aCnc: <14 IU/mL (ref <14)

## 2016-12-16 LAB — ANGIOTENSIN CONVERTING ENZYME: ANGIOTENSIN-CONVERTING ENZYME: 36 U/L (ref 9–67)

## 2016-12-16 LAB — CYCLIC CITRUL PEPTIDE ANTIBODY, IGG

## 2016-12-16 LAB — EXTRA URINE SPECIMEN

## 2016-12-21 ENCOUNTER — Telehealth: Payer: Self-pay | Admitting: Pulmonary Disease

## 2016-12-21 NOTE — Telephone Encounter (Signed)
Spoke with pt, states that his pharmacy has suggested to switch his Trexall 15mg  q7d to Trexall 2.5mg  X6 days weekly.  Pt states this change is to help with the cost of medication.    [facility] uses [facility] on [address].  PM please advise if ok to switch.  Thanks.

## 2016-12-22 MED ORDER — METHOTREXATE 2.5 MG PO TABS: 15.0000 mg | ORAL_TABLET | ORAL | 1 refills | Status: DC

## 2016-12-22 NOTE — Telephone Encounter (Signed)
Yes. Ok to make the change.

## 2016-12-22 NOTE — Telephone Encounter (Signed)
OK to switch 

## 2016-12-22 NOTE — Telephone Encounter (Signed)
New Rx sent to pharmacy and was changed to the alternative.

## 2016-12-22 NOTE — Telephone Encounter (Signed)
[facility] and [facility] stated that the insurance would cover the methotrexate 2.5 mg  And take 6 tablets every 7 days, then this would be a huge savings to the pt.  PM please advise if this is ok to change from the Trexall 15 mg.  Thanks  No Known Allergies

## 2016-12-26 ENCOUNTER — Inpatient Hospital Stay: Admission: RE | Admit: 2016-12-26 | Payer: BLUE CROSS/BLUE SHIELD | Source: Ambulatory Visit

## 2017-01-19 ENCOUNTER — Encounter (HOSPITAL_BASED_OUTPATIENT_CLINIC_OR_DEPARTMENT_OTHER): Payer: BLUE CROSS/BLUE SHIELD

## 2017-02-04 ENCOUNTER — Ambulatory Visit (HOSPITAL_BASED_OUTPATIENT_CLINIC_OR_DEPARTMENT_OTHER): Payer: BLUE CROSS/BLUE SHIELD

## 2017-02-17 ENCOUNTER — Encounter: Payer: Self-pay | Admitting: Urgent Care

## 2017-02-17 ENCOUNTER — Ambulatory Visit: Payer: BLUE CROSS/BLUE SHIELD | Admitting: Urgent Care

## 2017-02-17 VITALS — BP 138/82 | HR 68 | Temp 98.2°F | Resp 16 | Ht 68.0 in | Wt 197.0 lb

## 2017-02-17 DIAGNOSIS — Z20828 Contact with and (suspected) exposure to other viral communicable diseases: Secondary | ICD-10-CM

## 2017-02-17 NOTE — Progress Notes (Signed)
  MRN: [id number] DOB: 02/06/1980  Subjective:   Jeffery Yates is a 37 y.o. male presenting for 1 week history of soreness, cough, fatigue that lasted 2 days but has since resolved since Monday of this week. His 39 year old daughter tested positive to the flu. His wife does not have symptoms.   Review of Systems  Constitutional: Negative for chills, fever and malaise/fatigue.  HENT: Negative for congestion, ear discharge, ear pain, sinus pain and sore throat.   Respiratory: Negative for cough, hemoptysis, sputum production, shortness of breath and wheezing.   Cardiovascular: Negative for chest pain.  Gastrointestinal: Negative for abdominal pain, diarrhea, nausea and vomiting.   Jeffery Yates has a current medication list which includes the following prescription(s): folic acid and methotrexate. Also has No Known Allergies.  Jeffery Yates  has a past medical history of Sarcoidosis, lung (HCC). Denies past surgical history.  Objective:   Vitals: BP 138/82   Pulse 68   Temp 98.2 F (36.8 C) (Oral)   Resp 16   Ht 5\' 8"  (1.727 m)   Wt 197 lb (89.4 kg)   SpO2 97%   BMI 29.95 kg/m   Physical Exam  Constitutional: He is oriented to person, place, and time. He appears well-developed and well-nourished.  HENT:  Mouth/Throat: Oropharynx is clear and moist.  TM's normal.  Eyes: Right eye exhibits no discharge. Left eye exhibits no discharge.  Cardiovascular: Normal rate, regular rhythm and intact distal pulses. Exam reveals no gallop and no friction rub.  No murmur heard. Pulmonary/Chest: No respiratory distress. He has no wheezes. He has no rales.  Neurological: He is alert and oriented to person, place, and time.  Skin: Skin is warm and dry.  Psychiatric: He has a normal mood and affect.   Assessment and Plan :   Exposure to the flu  Anticipatory guidance provided. Patient is asymptomatic. Return-to-clinic precautions discussed, patient verbalized understanding.   Wallis BambergMario Jeraline Marcinek, PA-C Primary  Care at [facility] [phone] 02/17/2017  2:34 PM

## 2017-02-17 NOTE — Patient Instructions (Addendum)
Influenza, Adult Influenza, more commonly known as "the flu," is a viral infection that primarily affects the respiratory tract. The respiratory tract includes organs that help you breathe, such as the lungs, nose, and throat. The flu causes many common cold symptoms, as well as a high fever and body aches. The flu spreads easily from person to person (is contagious). Getting a flu shot (influenza vaccination) every year is the best way to prevent influenza. What are the causes? Influenza is caused by a virus. You can catch the virus by:  Breathing in droplets from an infected person's cough or sneeze.  Touching something that was recently contaminated with the virus and then touching your mouth, nose, or eyes.  What increases the risk? The following factors may make you more likely to get the flu:  Not cleaning your hands frequently with soap and water or alcohol-based hand sanitizer.  Having close contact with many people during cold and flu season.  Touching your mouth, eyes, or nose without washing or sanitizing your hands first.  Not drinking enough fluids or not eating a healthy diet.  Not getting enough sleep or exercise.  Being under a high amount of stress.  Not getting a yearly (annual) flu shot.  You may be at a higher risk of complications from the flu, such as a severe lung infection (pneumonia), if you:  Are over the age of 65.  Are pregnant.  Have a weakened disease-fighting system (immune system). You may have a weakened immune system if you: ? Have HIV or AIDS. ? Are undergoing chemotherapy. ? Aretaking medicines that reduce the activity of (suppress) the immune system.  Have a long-term (chronic) illness, such as heart disease, kidney disease, diabetes, or lung disease.  Have a liver disorder.  Are obese.  Have anemia.  What are the signs or symptoms? Symptoms of this condition typically last 4-10 days and may  include:  Fever.  Chills.  Headache, body aches, or muscle aches.  Sore throat.  Cough.  Runny or congested nose.  Chest discomfort and cough.  Poor appetite.  Weakness or tiredness (fatigue).  Dizziness.  Nausea or vomiting.  How is this diagnosed? This condition may be diagnosed based on your medical history and a physical exam. Your health care provider may do a nose or throat swab test to confirm the diagnosis. How is this treated? If influenza is detected early, you can be treated with antiviral medicine that can reduce the length of your illness and the severity of your symptoms. This medicine may be given by mouth (orally) or through an IV tube that is inserted in one of your veins. The goal of treatment is to relieve symptoms by taking care of yourself at home. This may include taking over-the-counter medicines, drinking plenty of fluids, and adding humidity to the air in your home. In some cases, influenza goes away on its own. Severe influenza or complications from influenza may be treated in a hospital. Follow these instructions at home:  Take over-the-counter and prescription medicines only as told by your health care provider.  Use a cool mist humidifier to add humidity to the air in your home. This can make breathing easier.  Rest as needed.  Drink enough fluid to keep your urine clear or pale yellow.  Cover your mouth and nose when you cough or sneeze.  Wash your hands with soap and water often, especially after you cough or sneeze. If soap and water are not available, use hand sanitizer.    Stay home from work or school as told by your health care provider. Unless you are visiting your health care provider, try to avoid leaving home until your fever has been gone for 24 hours without the use of medicine.  Keep all follow-up visits as told by your health care provider. This is important. How is this prevented?  Getting an annual flu shot is the best way  to avoid getting the flu. You may get the flu shot in late summer, fall, or winter. Ask your health care provider when you should get your flu shot.  Wash your hands often or use hand sanitizer often.  Avoid contact with people who are sick during cold and flu season.  Eat a healthy diet, drink plenty of fluids, get enough sleep, and exercise regularly. Contact a health care provider if:  You develop new symptoms.  You have: ? Chest pain. ? Diarrhea. ? A fever.  Your cough gets worse.  You produce more mucus.  You feel nauseous or you vomit. Get help right away if:  You develop shortness of breath or difficulty breathing.  Your skin or nails turn a bluish color.  You have severe pain or stiffness in your neck.  You develop a sudden headache or sudden pain in your face or ear.  You cannot stop vomiting. This information is not intended to replace advice given to you by your health care provider. Make sure you discuss any questions you have with your health care provider. Document Released: 12/25/1999 Document Revised: 06/04/2015 Document Reviewed: 10/21/2014 Elsevier Interactive Patient Education  2017 Elsevier Inc.    IF you received an x-ray today, you will receive an invoice from [facility]. Please contact [facility] at [phone] with questions or concerns regarding your invoice.   IF you received labwork today, you will receive an invoice from [facility]. Please contact [facility] at [phone] with questions or concerns regarding your invoice.   Our billing staff will not be able to assist you with questions regarding bills from these companies.  You will be contacted with the lab results as soon as they are available. The fastest way to get your results is to activate your My Chart account. Instructions are located on the last page of this paperwork. If you have not heard from us regarding the results in 2 weeks, please contact this office.       

## 2017-03-04 ENCOUNTER — Ambulatory Visit (HOSPITAL_BASED_OUTPATIENT_CLINIC_OR_DEPARTMENT_OTHER): Payer: BLUE CROSS/BLUE SHIELD | Attending: Pulmonary Disease | Admitting: Pulmonary Disease

## 2017-03-13 DIAGNOSIS — R5383 Other fatigue: Secondary | ICD-10-CM | POA: Diagnosis not present

## 2017-03-13 DIAGNOSIS — M2550 Pain in unspecified joint: Secondary | ICD-10-CM | POA: Diagnosis not present

## 2017-03-13 DIAGNOSIS — M6281 Muscle weakness (generalized): Secondary | ICD-10-CM | POA: Diagnosis not present

## 2017-03-13 DIAGNOSIS — D869 Sarcoidosis, unspecified: Secondary | ICD-10-CM | POA: Diagnosis not present

## 2017-03-23 ENCOUNTER — Telehealth: Payer: Self-pay | Admitting: Pulmonary Disease

## 2017-03-23 MED ORDER — PREDNISONE 10 MG PO TABS: ORAL_TABLET | ORAL | 0 refills | Status: DC

## 2017-03-23 NOTE — Telephone Encounter (Signed)
Spoke with pt. He is aware of CY's recommendations. Rx has been sent in. Nothing further was needed. 

## 2017-03-23 NOTE — Telephone Encounter (Signed)
Called and spoke to pt. Pt feels that he is having a sarcoid flare. Pt reports of increased fatigue, joint pain, temp of 100 last night, chills, heavy breathing and difficulty swallowing x1.5w  CY please advise, as Dr. Isaiah SergeMannam is unavailable. Thanks   Current Outpatient Medications on File Prior to Visit  Medication Sig Dispense Refill  . folic acid (FOLVITE) 1 MG tablet Take 1 tablet (1 mg total) by mouth daily. 30 tablet 2  . methotrexate (RHEUMATREX) 2.5 MG tablet Take 6 tablets (15 mg total) by mouth once a week. Caution:Chemotherapy. Protect from light. 24 tablet 1   No current facility-administered medications on file prior to visit.     No Known Allergies

## 2017-03-23 NOTE — Telephone Encounter (Signed)
Can't tell if this is sarcoid or maybe a viral bronchitis.  Recommend - prednisone 10 mg, # 20, 4 X 2 DAYS, 3 X 2 DAYS, 2 X 2 DAYS, 1 X 2 DAYS  If he begins coughing up purulent phlegm or notes something else that might be infection, then we may need to five him an antibiotic.

## 2017-03-31 ENCOUNTER — Encounter: Payer: Self-pay | Admitting: Urgent Care

## 2017-03-31 ENCOUNTER — Ambulatory Visit: Payer: BLUE CROSS/BLUE SHIELD | Admitting: Urgent Care

## 2017-03-31 ENCOUNTER — Other Ambulatory Visit: Payer: Self-pay | Admitting: Pulmonary Disease

## 2017-03-31 VITALS — BP 122/76 | HR 72 | Temp 98.0°F | Resp 16 | Ht 68.0 in | Wt 197.8 lb

## 2017-03-31 DIAGNOSIS — F4320 Adjustment disorder, unspecified: Secondary | ICD-10-CM | POA: Diagnosis not present

## 2017-03-31 DIAGNOSIS — R0981 Nasal congestion: Secondary | ICD-10-CM

## 2017-03-31 DIAGNOSIS — D869 Sarcoidosis, unspecified: Secondary | ICD-10-CM

## 2017-03-31 DIAGNOSIS — J019 Acute sinusitis, unspecified: Secondary | ICD-10-CM

## 2017-03-31 MED ORDER — AMOXICILLIN 875 MG PO TABS: 875.0000 mg | ORAL_TABLET | Freq: Two times a day (BID) | ORAL | 0 refills | Status: DC

## 2017-03-31 NOTE — Progress Notes (Signed)
    MRN: [id number] DOB: 1980-03-14  Subjective:   Jeffery Yates is a 37 y.o. male presenting for 2 week history of sinus congestion, bloody nasal mucus, malaise, subjective fever, sore throat. Patient was given course of prednisone for bronchitis by his pulmonologist. He finished this yesterday. Has history of sarcoidosis, managed with methotrexate. Admits shob, feels like his sarcoidosis is flaring up. Denies smoking cigarettes.   [name] has a current medication list which includes the following prescription(s): folic acid and methotrexate. Also has No Known Allergies.  [name]  has a past medical history of Sarcoidosis, lung (HCC). Also  has no past surgical history on file.  Objective:   Vitals: BP 122/76   Pulse 72   Temp 98 F (36.7 C) (Oral)   Resp 16   Ht 5\' 8"  (1.727 m)   Wt 197 lb 12.8 oz (89.7 kg)   SpO2 100%   BMI 30.08 kg/m   Physical Exam  Constitutional: He is oriented to person, place, and time. He appears well-developed and well-nourished.  HENT:  Nose: Mucosal edema (with erythema, dry) present. No sinus tenderness.  Mouth/Throat: Oropharynx is clear and moist.  Eyes: Right eye exhibits no discharge. Left eye exhibits no discharge.  Neck: Normal range of motion. Neck supple.  Cardiovascular: Normal rate, regular rhythm and intact distal pulses. Exam reveals no gallop and no friction rub.  No murmur heard. Pulmonary/Chest: No respiratory distress. He has no wheezes. He has no rales.  Lymphadenopathy:    He has no cervical adenopathy.  Neurological: He is alert and oriented to person, place, and time.  Skin: Skin is warm and dry.  Psychiatric: He has a normal mood and affect.   Assessment and Plan :   Acute sinusitis, recurrence not specified, unspecified location  Sinus congestion  Sarcoidosis  Will start patient on amoxicillin for sinusitis. Supportive care recommended otherwise. Patient is to maintain his methotrexate for his sarcoidosis.  Return-to-clinic precautions discussed, patient verbalized understanding.   Wallis BambergMario Pedro Whiters, PA-C Primary Care at [facility] [phone] 03/31/2017  5:52 PM

## 2017-03-31 NOTE — Patient Instructions (Addendum)
Sinusitis, Adult Sinusitis is soreness and inflammation of your sinuses. Sinuses are hollow spaces in the bones around your face. They are located:  Around your eyes.  In the middle of your forehead.  Behind your nose.  In your cheekbones.  Your sinuses and nasal passages are lined with a stringy fluid (mucus). Mucus normally drains out of your sinuses. When your nasal tissues get inflamed or swollen, the mucus can get trapped or blocked so air cannot flow through your sinuses. This lets bacteria, viruses, and funguses grow, and that leads to infection. Follow these instructions at home: Medicines  Take, use, or apply over-the-counter and prescription medicines only as told by your doctor. These may include nasal sprays.  If you were prescribed an antibiotic medicine, take it as told by your doctor. Do not stop taking the antibiotic even if you start to feel better. Hydrate and Humidify  Drink enough water to keep your pee (urine) clear or pale yellow.  Use a cool mist humidifier to keep the humidity level in your home above 50%.  Breathe in steam for 10-15 minutes, 3-4 times a day or as told by your doctor. You can do this in the bathroom while a hot shower is running.  Try not to spend time in cool or dry air. Rest  Rest as much as possible.  Sleep with your head raised (elevated).  Make sure to get enough sleep each night. General instructions  Put a warm, moist washcloth on your face 3-4 times a day or as told by your doctor. This will help with discomfort.  Wash your hands often with soap and water. If there is no soap and water, use hand sanitizer.  Do not smoke. Avoid being around people who are smoking (secondhand smoke).  Keep all follow-up visits as told by your doctor. This is important. Contact a doctor if:  You have a fever.  Your symptoms get worse.  Your symptoms do not get better within 10 days. Get help right away if:  You have a very bad  headache.  You cannot stop throwing up (vomiting).  You have pain or swelling around your face or eyes.  You have trouble seeing.  You feel confused.  Your neck is stiff.  You have trouble breathing. This information is not intended to replace advice given to you by your health care provider. Make sure you discuss any questions you have with your health care provider. Document Released: 06/15/2007 Document Revised: 08/23/2015 Document Reviewed: 10/22/2014 Elsevier Interactive Patient Education  2018 Elsevier Inc.     IF you received an x-ray today, you will receive an invoice from [facility]. Please contact [facility] at [phone] with questions or concerns regarding your invoice.   IF you received labwork today, you will receive an invoice from [facility]. Please contact [facility] at [phone] with questions or concerns regarding your invoice.   Our billing staff will not be able to assist you with questions regarding bills from these companies.  You will be contacted with the lab results as soon as they are available. The fastest way to get your results is to activate your My Chart account. Instructions are located on the last page of this paperwork. If you have not heard from us regarding the results in 2 weeks, please contact this office.    ' 

## 2017-05-08 ENCOUNTER — Other Ambulatory Visit: Payer: Self-pay | Admitting: Pulmonary Disease

## 2017-06-01 ENCOUNTER — Telehealth: Payer: Self-pay | Admitting: Pulmonary Disease

## 2017-06-01 MED ORDER — METHOTREXATE 2.5 MG PO TABS: ORAL_TABLET | ORAL | 0 refills | Status: DC

## 2017-06-01 NOTE — Telephone Encounter (Signed)
Pt made appt on 06/14/2017. I refilled the Methotrexate for one month so he would not be without. Pt understood. Nothing further is needed.

## 2017-06-01 NOTE — Telephone Encounter (Signed)
ATC pt, no answer. Left message for pt to call back.  

## 2017-06-01 NOTE — Telephone Encounter (Signed)
Pt was supposed to recheck in 3 months. He needs an appointment with Dr. Isaiah Serge. I could give him 30 days but he would have to come in. I will await to speak to pt.    Patient Instructions by Chilton Greathouse, MD at 12/15/2016 9:00 AM  Author: Chilton Greathouse, MD Author Type: Physician Filed: 12/15/2016 9:45 AM  Note Status: Addendum Cosign: Cosign Not Required Encounter Date: 12/15/2016  Editor: Chilton Greathouse, MD (Physician)  Prior Versions: 1. Chilton Greathouse, MD (Physician) at 12/15/2016 9:38 AM - Signed    We will renew your prescription for methotrexate and folic acid We will schedule you for a high-resolution CT, pulmonary function test Check labs today including comprehensive metabolic profile, CBC with differential, angiotensin-converting enzyme, sed rate, CRP, ANA with reflex, rheumatoid factor, CCP, serum and urine calcium levels, EKG We will refer you to ophthalmology for examination of your eye to rule out involvement by sarcoid We will schedule you for a split-night sleep study Follow-up in 3 months.

## 2017-06-01 NOTE — Telephone Encounter (Signed)
Patient states he cannot be reached until 4:30 pm today.

## 2017-06-08 ENCOUNTER — Ambulatory Visit (INDEPENDENT_AMBULATORY_CARE_PROVIDER_SITE_OTHER): Payer: BLUE CROSS/BLUE SHIELD | Admitting: Pulmonary Disease

## 2017-06-08 DIAGNOSIS — D869 Sarcoidosis, unspecified: Secondary | ICD-10-CM

## 2017-06-08 LAB — PULMONARY FUNCTION TEST
DL/VA % pred: 97 %
DL/VA: 4.46 ml/min/mmHg/L
DLCO UNC: 24.84 ml/min/mmHg
DLCO unc % pred: 81 %
FEF 25-75 Post: 4.73 L/sec
FEF 25-75 Pre: 3.66 L/sec
FEF2575-%Change-Post: 29 %
FEF2575-%Pred-Post: 129 %
FEF2575-%Pred-Pre: 100 %
FEV1-%CHANGE-POST: 6 %
FEV1-%PRED-PRE: 98 %
FEV1-%Pred-Post: 105 %
FEV1-POST: 3.62 L
FEV1-Pre: 3.39 L
FEV1FVC-%Change-Post: 6 %
FEV1FVC-%Pred-Pre: 99 %
FEV6-%Change-Post: 0 %
FEV6-%PRED-POST: 101 %
FEV6-%PRED-PRE: 101 %
FEV6-POST: 4.16 L
FEV6-PRE: 4.14 L
FEV6FVC-%CHANGE-POST: 0 %
FEV6FVC-%PRED-POST: 101 %
FEV6FVC-%PRED-PRE: 101 %
FVC-%Change-Post: 0 %
FVC-%Pred-Post: 99 %
FVC-%Pred-Pre: 99 %
FVC-Post: 4.16 L
FVC-Pre: 4.16 L
POST FEV6/FVC RATIO: 100 %
PRE FEV6/FVC RATIO: 99 %
Post FEV1/FVC ratio: 87 %
Pre FEV1/FVC ratio: 82 %
RV % PRED: 89 %
RV: 1.54 L
TLC % PRED: 85 %
TLC: 5.69 L

## 2017-06-08 NOTE — Progress Notes (Signed)
PFT done today. 

## 2017-06-13 DIAGNOSIS — D869 Sarcoidosis, unspecified: Secondary | ICD-10-CM | POA: Diagnosis not present

## 2017-06-13 DIAGNOSIS — M2550 Pain in unspecified joint: Secondary | ICD-10-CM | POA: Diagnosis not present

## 2017-06-13 DIAGNOSIS — R5383 Other fatigue: Secondary | ICD-10-CM | POA: Diagnosis not present

## 2017-06-13 DIAGNOSIS — Z79899 Other long term (current) drug therapy: Secondary | ICD-10-CM | POA: Diagnosis not present

## 2017-06-14 ENCOUNTER — Other Ambulatory Visit: Payer: BLUE CROSS/BLUE SHIELD

## 2017-06-14 ENCOUNTER — Ambulatory Visit (INDEPENDENT_AMBULATORY_CARE_PROVIDER_SITE_OTHER): Payer: BLUE CROSS/BLUE SHIELD | Admitting: Pulmonary Disease

## 2017-06-14 ENCOUNTER — Encounter: Payer: Self-pay | Admitting: Pulmonary Disease

## 2017-06-14 VITALS — BP 120/76 | HR 62 | Ht 68.0 in | Wt 198.0 lb

## 2017-06-14 DIAGNOSIS — R0683 Snoring: Secondary | ICD-10-CM | POA: Diagnosis not present

## 2017-06-14 DIAGNOSIS — D869 Sarcoidosis, unspecified: Secondary | ICD-10-CM | POA: Diagnosis not present

## 2017-06-14 NOTE — Patient Instructions (Addendum)
We will get an EKG today and check urine for calcium We will schedule you for high-resolution CT and check urine for calcium You will need referral to ophthalmology Continue on methotrexate  Follow-up in 3 months.

## 2017-06-14 NOTE — Progress Notes (Addendum)
Jeffery Yates    [id number]    17-Jun-1980  Primary Care Physician:Patient, No Pcp Per  Referring Physician: No referring provider defined for this encounter.  Chief complaint: Follow up for sarcoidosis  HPI: 37 year old with sarcoidosis. He was initially diagnosed around 2005 in [place] and found to have stomach involvement of sarcoidosis on EGD with biopsy.  He was previously followed at [facility].  As per notes he has involvement of stomach, liver, pulmonary.  He underwent a liver biopsy in December 2017 which showed hepatic sarcoid.  Treated initially with prednisone which had to be stopped due to side effects.  He has been maintained on methotrexate and folic acid.  Transferred care to [facility] since it is closer to his home. Chief complaint is chronic fatigue which is unchanged from baseline.  He has joint pain and stiffness.  Symptoms of snoring at night.  Denies daytime sleepiness.  Reports having a kidney stone earlier this year.  Occupation: Wellsite geologist at [employer] Smoking history: Never smoker Travel History: Not significant  Interim history: He is established with Dr. Nickola Major, rheumatology.  Continues on methotrexate.  Recently started on meloxicam for joint pain States that breathing is doing well.  Reviewed notes from Dr. Nickola Major, rheumatology Seen on 03/13/2017 Notes that he is doing well on methotrexate although he has had fatigue and muscle ache He may benefit from a higher dose of methotrexate.  If he continues to have flares then we can increase the dose.   Outpatient Encounter Medications as of 06/14/2017  Medication Sig  . folic acid (FOLVITE) 1 MG tablet Take 1 tablet (1 mg total) by mouth daily.  . meloxicam (MOBIC) 15 MG tablet   . methotrexate (RHEUMATREX) 2.5 MG tablet TAKE 6 TABLETS BY MOUTH ONCE A WEEK  . [DISCONTINUED] amoxicillin (AMOXIL) 875 MG tablet Take 1 tablet (875 mg total) by mouth 2 (two) times daily with  a meal.   No facility-administered encounter medications on file as of 06/14/2017.     Allergies as of 06/14/2017  . (No Known Allergies)    Past Medical History:  Diagnosis Date  . Sarcoidosis, lung (HCC)     No past surgical history on file.  No family history on file.  Social History   Socioeconomic History  . Marital status: Single    Spouse name: Not on file  . Number of children: Not on file  . Years of education: Not on file  . Highest education level: Not on file  Occupational History  . Not on file  Social Needs  . Financial resource strain: Not on file  . Food insecurity:    Worry: Not on file    Inability: Not on file  . Transportation needs:    Medical: Not on file    Non-medical: Not on file  Tobacco Use  . Smoking status: Never Smoker  . Smokeless tobacco: Never Used  Substance and Sexual Activity  . Alcohol use: No  . Drug use: No  . Sexual activity: Not on file  Lifestyle  . Physical activity:    Days per week: Not on file    Minutes per session: Not on file  . Stress: Not on file  Relationships  . Social connections:    Talks on phone: Not on file    Gets together: Not on file    Attends religious service: Not on file    Active member of club or organization: Not  on file    Attends meetings of clubs or organizations: Not on file    Relationship status: Not on file  . Intimate partner violence:    Fear of current or ex partner: Not on file    Emotionally abused: Not on file    Physically abused: Not on file    Forced sexual activity: Not on file  Other Topics Concern  . Not on file  Social History Narrative  . Not on file    Review of systems: Review of Systems  Constitutional: Negative for fever and chills.  HENT: Negative.   Eyes: Negative for blurred vision.  Respiratory: as per HPI  Cardiovascular: Negative for chest pain and palpitations.  Gastrointestinal: Negative for vomiting, diarrhea, blood per rectum. Genitourinary:  Negative for dysuria, urgency, frequency and hematuria.  Musculoskeletal: Negative for myalgias, back pain and joint pain.  Skin: Negative for itching and rash.  Neurological: Negative for dizziness, tremors, focal weakness, seizures and loss of consciousness.  Endo/Heme/Allergies: Negative for environmental allergies.  Psychiatric/Behavioral: Negative for depression, suicidal ideas and hallucinations.  All other systems reviewed and are negative.  Physical Exam: Blood pressure 118/74, pulse 78, height 5\' 8"  (1.727 m), weight 196 lb 12.8 oz (89.3 kg), SpO2 98 %. Gen:      No acute distress HEENT:  EOMI, sclera anicteric Neck:     No masses; no thyromegaly Lungs:    Clear to auscultation bilaterally; normal respiratory effort CV:         Regular rate and rhythm; no murmurs Abd:      + bowel sounds; soft, non-tender; no palpable masses, no distension Ext:    No edema; adequate peripheral perfusion Skin:      Warm and dry; no rash Neuro: alert and oriented x 3 Psych: normal mood and affect  Data Reviewed: Chest x-ray 08/11/16- Bilateral interstitial opacities consistent with sarcoid.  I have reviewed the images of personally.  PFTs 06/08/1988 FVC 4.16 [99%], FEV1 3.6 (105%), F/F 87, TLC 85%, DLCO 81% Normal study  Labs 12/15/2016 Complete metabolic panel, CBC-normal ANA, angiotensin-converting enzyme, CCP, rheumatoid factor-normal   EKG 06/14/2017-sinus bradycardia.  No acute ST-T changes.    Assessment:  Sarcoidosis with multisystem involvement Has involvement of joints, liver and lungs.  Maintained on methotrexate for many years with stable symptoms Continues on methotrexate.  Recently started on meloxicam for joint pain  PFTs reviewed which show normal lung function.  High-resolution CT ordered at last visit but not done yet Re refer to ophthalmology as he has not kept his appointment.  Suspected sleep apnea Untreated sleep apnea may be contributing to his ongoing fatigue.  He  has not kept his appointment with the sleep study.   Reschedule sleep study  Health maintenance 10/21/2014-influenza 10/18/2013-Pneumovax  Plan/Recommendations: - Continue methotrexate, folic acid - High res CT - Referral to ophthalmology - Sleep study  Chilton GreathousePraveen Trysta Showman MD [facility] 06/14/2017, 11:06 AM  CC: No ref. provider found   Addendum [facility] Dr. Nickola MajorHawkes dated 06/13/2017 Follow-up for sarcoidosis which is stable.  Continue on methotrexate 15 mg weekly, folic acid Discontinue Motrin, try meloxicam for inflammatory arthritis.  Chilton GreathousePraveen Arvada Seaborn MD [facility] 08/05/2017, 2:25 PM   Addendum Reviewed [facility], Dr. Nickola MajorHawkes dated 06/11/2018 Currently on methrorexate 20 mg/week and folic acid Has some URI symptoms with cough and congestion Recommended OTC treatments and rest and to call Pulm office if he feels worse.  Chilton Greathouse MD [facility] 06/25/2018, 1:25 PM   Addendum To the note from [facility] rheumatology, Dr. Nickola Major dated 04/02/2018 Continues on methotrexate 20 mg a day and folic acid.  Chilton Greathouse MD [facility] 07/21/2018, 1:02 PM

## 2017-06-29 ENCOUNTER — Inpatient Hospital Stay: Admission: RE | Admit: 2017-06-29 | Payer: BLUE CROSS/BLUE SHIELD | Source: Ambulatory Visit

## 2017-07-05 ENCOUNTER — Ambulatory Visit (HOSPITAL_BASED_OUTPATIENT_CLINIC_OR_DEPARTMENT_OTHER): Payer: BLUE CROSS/BLUE SHIELD | Attending: Pulmonary Disease | Admitting: Pulmonary Disease

## 2017-07-05 ENCOUNTER — Ambulatory Visit (INDEPENDENT_AMBULATORY_CARE_PROVIDER_SITE_OTHER)
Admission: RE | Admit: 2017-07-05 | Discharge: 2017-07-05 | Disposition: A | Discharge status: Home / Self Care | Payer: BLUE CROSS/BLUE SHIELD | Source: Ambulatory Visit | Attending: Pulmonary Disease | Admitting: Pulmonary Disease

## 2017-07-05 VITALS — Ht 68.0 in | Wt 185.0 lb

## 2017-07-05 DIAGNOSIS — D869 Sarcoidosis, unspecified: Secondary | ICD-10-CM

## 2017-07-05 DIAGNOSIS — G4733 Obstructive sleep apnea (adult) (pediatric): Secondary | ICD-10-CM | POA: Insufficient documentation

## 2017-07-05 DIAGNOSIS — R0683 Snoring: Secondary | ICD-10-CM | POA: Insufficient documentation

## 2017-07-05 DIAGNOSIS — J988 Other specified respiratory disorders: Secondary | ICD-10-CM | POA: Diagnosis not present

## 2017-07-05 DIAGNOSIS — H52223 Regular astigmatism, bilateral: Secondary | ICD-10-CM | POA: Diagnosis not present

## 2017-07-06 NOTE — Procedures (Signed)
    Patient Name: Jeffery Yates, Lawerance Study Date: 07/05/2017 Gender: Male D.O.B: [birth year] Age (years): 37 Referring Provider: Chilton GreathousePRAVEEN MANNAM Height (inches): 68 Interpreting Physician: Coralyn HellingVineet Demetrios Byron MD, ABSM Weight (lbs): 185 RPSGT: Shelah LewandowskyGregory, Kenyon BMI: 28 MRN: [id number] Neck Size: 15.75 <br> <br> CLINICAL INFORMATION Sleep Study Type: NPSG    Indication for sleep study: Fatigue, Snoring    SLEEP STUDY TECHNIQUE As per the AASM Manual for the Scoring of Sleep and Associated Events v2.3 (April 2016) with a hypopnea requiring 4% desaturations.  The channels recorded and monitored were frontal, central and occipital EEG, electrooculogram (EOG), submentalis EMG (chin), nasal and oral airflow, thoracic and abdominal wall motion, anterior tibialis EMG, snore microphone, electrocardiogram, and pulse oximetry.  MEDICATIONS Medications self-administered by patient taken the night of the study : N/A  SLEEP ARCHITECTURE The study was initiated at 10:40:40 PM and ended at 5:18:58 AM.  Sleep onset time was 57.6 minutes and the sleep efficiency was 83.0%%. The total sleep time was 330.5 minutes.  Stage REM latency was 73.5 minutes.  The patient spent 4.5%% of the night in stage N1 sleep, 84.0%% in stage N2 sleep, 0.0%% in stage N3 and 11.50% in REM.  Alpha intrusion was absent.  Supine sleep was 31.78%.  RESPIRATORY PARAMETERS The overall apnea/hypopnea index (AHI) was 0.5 per hour. There were 1 total apneas, including 1 obstructive, 0 central and 0 mixed apneas. There were 2 hypopneas and 3 RERAs.  The AHI during Stage REM sleep was 1.6 per hour.  AHI while supine was 1.7 per hour.  The mean oxygen saturation was 95.0%. The minimum SpO2 during sleep was 90.0%.  moderate snoring was noted during this study.  CARDIAC DATA The 2 lead EKG demonstrated sinus rhythm. The mean heart rate was 54.4 beats per minute. Other EKG findings include: None. LEG MOVEMENT DATA The total  PLMS were 0 with a resulting PLMS index of 0.0. Associated arousal with leg movement index was 0.0 .  IMPRESSIONS - While he had a few obstructive respiratory events, these were not frequent enough to meet diagnostic criteria for obstructive sleep apnea.  His overall AHI was 0.5 with an SpO2 low of 90%.  He did not require supplemental oxygen during this study. - The patient snored with moderate snoring volume. DIAGNOSIS - Snoring. RECOMMENDATIONS - Avoid alcohol, sedatives and other CNS depressants that may worsen sleep apnea and disrupt normal sleep architecture. - Sleep hygiene should be reviewed to assess factors that may improve sleep quality. - Weight management and regular exercise should be initiated or continued if appropriate.  [Electronically signed] 07/06/2017 10:29 AM  Coralyn HellingVineet Elzia Hott MD, ABSM Diplomate, American Board of Sleep Medicine NPI: [id number]

## 2017-07-07 ENCOUNTER — Telehealth: Payer: Self-pay

## 2017-07-07 NOTE — Telephone Encounter (Signed)
-----   Message from Chilton GreathousePraveen Mannam, MD sent at 07/06/2017  5:46 PM EDT ----- Please let patient know that study does not show any sleep apnea.  ----- Message ----- From: Chilton GreathouseMannam, Praveen, MD Sent: 07/06/2017  10:31 AM To: Chilton GreathousePraveen Mannam, MD

## 2017-07-07 NOTE — Telephone Encounter (Signed)
Pt is aware of results and voiced his understanding. Nothing further is needed.  

## 2017-07-19 ENCOUNTER — Other Ambulatory Visit: Payer: Self-pay | Admitting: Pulmonary Disease

## 2017-09-13 ENCOUNTER — Other Ambulatory Visit: Payer: Self-pay | Admitting: Pulmonary Disease

## 2017-10-02 DIAGNOSIS — Z79899 Other long term (current) drug therapy: Secondary | ICD-10-CM | POA: Diagnosis not present

## 2017-10-02 DIAGNOSIS — D869 Sarcoidosis, unspecified: Secondary | ICD-10-CM | POA: Diagnosis not present

## 2017-10-02 DIAGNOSIS — J849 Interstitial pulmonary disease, unspecified: Secondary | ICD-10-CM | POA: Diagnosis not present

## 2017-10-02 DIAGNOSIS — M2550 Pain in unspecified joint: Secondary | ICD-10-CM | POA: Diagnosis not present

## 2017-10-13 ENCOUNTER — Encounter: Payer: Self-pay | Admitting: Pulmonary Disease

## 2017-10-13 NOTE — Telephone Encounter (Signed)
error 

## 2018-01-01 DIAGNOSIS — Z79899 Other long term (current) drug therapy: Secondary | ICD-10-CM | POA: Diagnosis not present

## 2018-01-01 DIAGNOSIS — D869 Sarcoidosis, unspecified: Secondary | ICD-10-CM | POA: Diagnosis not present

## 2018-01-01 DIAGNOSIS — J849 Interstitial pulmonary disease, unspecified: Secondary | ICD-10-CM | POA: Diagnosis not present

## 2018-01-01 DIAGNOSIS — M2550 Pain in unspecified joint: Secondary | ICD-10-CM | POA: Diagnosis not present

## 2018-01-07 ENCOUNTER — Ambulatory Visit (HOSPITAL_COMMUNITY)
Admission: EM | Admit: 2018-01-07 | Discharge: 2018-01-07 | Discharge status: Against Medical Advice | Payer: BLUE CROSS/BLUE SHIELD

## 2018-01-25 ENCOUNTER — Ambulatory Visit: Payer: BLUE CROSS/BLUE SHIELD | Admitting: Emergency Medicine

## 2018-01-29 ENCOUNTER — Ambulatory Visit: Payer: BLUE CROSS/BLUE SHIELD

## 2018-01-29 ENCOUNTER — Ambulatory Visit (INDEPENDENT_AMBULATORY_CARE_PROVIDER_SITE_OTHER): Payer: BLUE CROSS/BLUE SHIELD | Admitting: Family Medicine

## 2018-01-29 DIAGNOSIS — Z23 Encounter for immunization: Secondary | ICD-10-CM | POA: Diagnosis not present

## 2018-01-29 NOTE — Progress Notes (Signed)
Flu vaccine visit - patient not seen.

## 2018-04-02 DIAGNOSIS — M2550 Pain in unspecified joint: Secondary | ICD-10-CM | POA: Diagnosis not present

## 2018-04-02 DIAGNOSIS — D869 Sarcoidosis, unspecified: Secondary | ICD-10-CM | POA: Diagnosis not present

## 2018-04-02 DIAGNOSIS — Z79899 Other long term (current) drug therapy: Secondary | ICD-10-CM | POA: Diagnosis not present

## 2018-04-30 DIAGNOSIS — D869 Sarcoidosis, unspecified: Secondary | ICD-10-CM | POA: Diagnosis not present

## 2018-06-11 DIAGNOSIS — M2550 Pain in unspecified joint: Secondary | ICD-10-CM | POA: Diagnosis not present

## 2018-06-11 DIAGNOSIS — J069 Acute upper respiratory infection, unspecified: Secondary | ICD-10-CM | POA: Diagnosis not present

## 2018-06-11 DIAGNOSIS — D869 Sarcoidosis, unspecified: Secondary | ICD-10-CM | POA: Diagnosis not present

## 2018-06-11 DIAGNOSIS — Z79899 Other long term (current) drug therapy: Secondary | ICD-10-CM | POA: Diagnosis not present

## 2018-07-05 DIAGNOSIS — Z20828 Contact with and (suspected) exposure to other viral communicable diseases: Secondary | ICD-10-CM | POA: Diagnosis not present

## 2018-12-11 ENCOUNTER — Other Ambulatory Visit: Payer: Self-pay

## 2018-12-11 DIAGNOSIS — Z20822 Contact with and (suspected) exposure to covid-19: Secondary | ICD-10-CM

## 2018-12-13 LAB — NOVEL CORONAVIRUS, NAA: SARS-CoV-2, NAA: NOT DETECTED

## 2018-12-19 ENCOUNTER — Telehealth (INDEPENDENT_AMBULATORY_CARE_PROVIDER_SITE_OTHER): Payer: BC Managed Care – PPO | Admitting: Pulmonary Disease

## 2018-12-19 ENCOUNTER — Ambulatory Visit (INDEPENDENT_AMBULATORY_CARE_PROVIDER_SITE_OTHER): Payer: BC Managed Care – PPO

## 2018-12-19 ENCOUNTER — Encounter: Payer: Self-pay | Admitting: Pulmonary Disease

## 2018-12-19 DIAGNOSIS — D869 Sarcoidosis, unspecified: Secondary | ICD-10-CM

## 2018-12-19 DIAGNOSIS — R0600 Dyspnea, unspecified: Secondary | ICD-10-CM | POA: Diagnosis not present

## 2018-12-19 MED ORDER — BUDESONIDE-FORMOTEROL FUMARATE 160-4.5 MCG/ACT IN AERO: 2.0000 | INHALATION_SPRAY | Freq: Two times a day (BID) | RESPIRATORY_TRACT | 3 refills | Status: DC

## 2018-12-19 MED ORDER — FOLIC ACID 1 MG PO TABS: 1.0000 mg | ORAL_TABLET | Freq: Every day | ORAL | 2 refills | Status: DC

## 2018-12-19 MED ORDER — ALBUTEROL SULFATE HFA 108 (90 BASE) MCG/ACT IN AERS: 2.0000 | INHALATION_SPRAY | Freq: Four times a day (QID) | RESPIRATORY_TRACT | 11 refills | Status: DC | PRN

## 2018-12-19 MED ORDER — METHOTREXATE SODIUM 10 MG PO TABS: 20.0000 mg | ORAL_TABLET | ORAL | 3 refills | Status: DC

## 2018-12-19 NOTE — Patient Instructions (Addendum)
We will restart the methotrexate at 20 mg a week and folic acid Start albuterol rescue inhaler and Symbicort We will check some labs today including comprehensive metabolic panel, CBC with differential and chest x-ray Follow-up in 1 month with televisit.

## 2018-12-19 NOTE — Progress Notes (Signed)
Jeffery Yates    [id number]    02/22/1980  Primary Care Physician:Patient, No Pcp Per  Referring Physician: No referring provider defined for this encounter.  Chief complaint: Follow televideo visit for sarcoidosis  HPI: 38 year old with sarcoidosis. He was initially diagnosed around 2005 in [place] and found to have stomach involvement of sarcoidosis on EGD with biopsy.  He was previously followed at [facility].  As per notes he has involvement of stomach, liver, pulmonary.  He underwent a liver biopsy in December 2017 which showed hepatic sarcoid.  Treated initially with prednisone which had to be stopped due to side effects.  He has been maintained on methotrexate and folic acid.  Transferred care to [facility] since it is closer to his home. Chief complaint is chronic fatigue which is unchanged from baseline.  He has joint pain and stiffness.  Symptoms of snoring at night.  Denies daytime sleepiness.  Reports having a kidney stone earlier this year.  Occupation: Metallurgist at [employer] Smoking history: Never smoker Travel History: Not significant  Interim history: Here for follow-up as a televisit after gap of more than a year He has followed up with Dr. Trudie Reed, rheumatology.  Review of her notes indicate that methotrexate was increased to 20 mg a week in late 2019/early 2020 due to fatigue, muscle ache  States that he is having increasing nonproductive cough, fatigue, muscle aches over the past few months Admits that he has not been compliant with his methotrexate.  He was tested for Covid on 12/1 with a negative result.  Reviewed notes from [facility] rheumatology, Dr. Trudie Reed dated 04/02/2018 and 06/11/18 Recommended methotrexate 20 mg a day and folic acid.  Outpatient Encounter Medications as of 12/19/2018  Medication Sig  . folic acid (FOLVITE) 1 MG tablet Take 1 tablet (1 mg total) by mouth daily.  . methotrexate (RHEUMATREX) 2.5 MG  tablet TAKE 6 TABLETS BY MOUTH ONCE A WEEK  . [DISCONTINUED] meloxicam (MOBIC) 15 MG tablet    No facility-administered encounter medications on file as of 12/19/2018.     Allergies as of 12/19/2018  . (No Known Allergies)   Physical Exam: Televisit  Data Reviewed: Chest x-ray 08/11/16- Bilateral interstitial opacities consistent with sarcoid. CT high-resolution 06/15/2017-nodular thickening of the fissures, bronchiectasis, bronchiolectasis with enlarged mediastinal lymph nodes.  Moderate air-trapping  I have reviewed the images of personally.  PFTs 06/08/1988 FVC 4.16 [99%], FEV1 3.6 (105%), F/F 87, TLC 85%, DLCO 81% Normal study  Labs 32/09/5186 Complete metabolic panel, CBC-normal ANA, angiotensin-converting enzyme, CCP, rheumatoid factor-normal   EKG 06/14/2017-sinus bradycardia.  No acute ST-T changes.    Assessment:  Sarcoidosis with multisystem involvement Has involvement of joints, liver and lungs.  Maintained on methotrexate for many years with stable symptoms  Has worsening symptoms consistent with sarcoid flare.  Covid testing negative this month He has been noncompliant with his methotrexate.  I have asked him to resume methotrexate at 20 mg/week as per rheumatology instructions  We will get baseline labs including comprehensive metabolic panel, LFTs, CBC and chest x-ray He will need high-res CT and PFTs but wants to defer these until the Covid pandemic is controlled  Previous CT reviewed with mild air trapping.  He may benefit from an inhaler We will start Symbicort and albuterol rescue inhaler..  Suspected sleep apnea Untreated sleep apnea may be contributing to his ongoing fatigue.  He has not kept his appointment with the sleep study.   Reassess  at next visit.  Health maintenance 10/18/2013-Pneumovax  Plan/Recommendations: -Resume methotrexate, folic acid -Chest x-ray, CBC, metabolic panel  Chilton Greathouse MD [facility] 12/19/2018,  9:07 AM  CC: No ref. provider found

## 2018-12-20 LAB — CBC WITH DIFFERENTIAL/PLATELET
Basophils Absolute: 0.3 10*3/uL — ABNORMAL HIGH (ref 0.0–0.1)
Basophils Relative: 3.7 % — ABNORMAL HIGH (ref 0.0–3.0)
Eosinophils Absolute: 0.4 10*3/uL (ref 0.0–0.7)
Eosinophils Relative: 6.3 % — ABNORMAL HIGH (ref 0.0–5.0)
HCT: 36.1 % — ABNORMAL LOW (ref 39.0–52.0)
Hemoglobin: 12.5 g/dL — ABNORMAL LOW (ref 13.0–17.0)
Lymphocytes Relative: 32 % (ref 12.0–46.0)
Lymphs Abs: 2.2 10*3/uL (ref 0.7–4.0)
MCHC: 34.7 g/dL (ref 30.0–36.0)
MCV: 87.3 fl (ref 78.0–100.0)
Monocytes Absolute: 1 10*3/uL (ref 0.1–1.0)
Monocytes Relative: 14.3 % — ABNORMAL HIGH (ref 3.0–12.0)
Neutro Abs: 3.1 10*3/uL (ref 1.4–7.7)
Neutrophils Relative %: 43.7 % (ref 43.0–77.0)
Platelets: 273 10*3/uL (ref 150.0–400.0)
RBC: 4.13 Mil/uL — ABNORMAL LOW (ref 4.22–5.81)
RDW: 12.5 % (ref 11.5–15.5)
WBC: 7 10*3/uL (ref 4.0–10.5)

## 2018-12-20 LAB — COMPREHENSIVE METABOLIC PANEL
ALT: 55 U/L — ABNORMAL HIGH (ref 0–53)
AST: 35 U/L (ref 0–37)
Albumin: 3.9 g/dL (ref 3.5–5.2)
Alkaline Phosphatase: 190 U/L — ABNORMAL HIGH (ref 39–117)
BUN: 23 mg/dL (ref 6–23)
CO2: 25 mEq/L (ref 19–32)
Calcium: 9.2 mg/dL (ref 8.4–10.5)
Chloride: 103 mEq/L (ref 96–112)
Creatinine, Ser: 1.24 mg/dL (ref 0.40–1.50)
GFR: 77.81 mL/min (ref >60.00)
Glucose, Bld: 127 mg/dL — ABNORMAL HIGH (ref 70–99)
Potassium: 3.8 mEq/L (ref 3.5–5.1)
Sodium: 136 mEq/L (ref 135–145)
Total Bilirubin: 0.8 mg/dL (ref 0.2–1.2)
Total Protein: 8.3 g/dL (ref 6.0–8.3)

## 2018-12-24 ENCOUNTER — Telehealth: Payer: Self-pay

## 2018-12-24 DIAGNOSIS — D869 Sarcoidosis, unspecified: Secondary | ICD-10-CM

## 2018-12-24 NOTE — Telephone Encounter (Signed)
I called and spoke with the patient and made him aware of his results. He verbalized understanding. I have ordered the CMP to be done in 1 month and I advised him that we would call to schedule once we have January schedule open.

## 2018-12-24 NOTE — Telephone Encounter (Signed)
-----   Message from Marshell Garfinkel, MD sent at 12/24/2018  1:00 PM EST ----- Chest x-ray shows stable changes of sarcoidosisHis labs show slight elevation in LFTs which may be coming from sarcoid involvement of the liverWe will need to keep a close watch on it.  It may improve now that we have resumed methotrexate  Please order repeat comprehensive metabolic panel in 1 month and arrange return clinic visit.

## 2019-01-09 NOTE — Telephone Encounter (Signed)
Dr. Mannam, please see pt's mychart message and advise on it for pt. Thanks! 

## 2019-01-23 ENCOUNTER — Telehealth: Payer: Self-pay | Admitting: Pulmonary Disease

## 2019-01-23 MED ORDER — METHOTREXATE 2.5 MG PO TABS: 20.0000 mg | ORAL_TABLET | ORAL | 4 refills | Status: DC

## 2019-01-23 NOTE — Telephone Encounter (Signed)
Instructions from televisit 12/19/18  We will restart the methotrexate at 20 mg a week and folic acid Start albuterol rescue inhaler and Symbicort We will check some labs today including comprehensive metabolic panel, CBC with differential and chest x-ray Follow-up in 1 month with televisit.     Called pt's pharmacy and spoke with Leotis Shames, pharmacist in regards to the Rx that was called in for pt for the methotrexate. Per Leotis Shames, the med only comes as a 2.5mg  tablet so she was going to change it to that dose and instructions for pt to take 8 tablets daily which is what has been done in the past for the pt. Nothing further needed.

## 2019-02-07 DIAGNOSIS — Z03818 Encounter for observation for suspected exposure to other biological agents ruled out: Secondary | ICD-10-CM | POA: Diagnosis not present

## 2019-02-20 DIAGNOSIS — Z20828 Contact with and (suspected) exposure to other viral communicable diseases: Secondary | ICD-10-CM | POA: Diagnosis not present

## 2019-02-20 DIAGNOSIS — Z7189 Other specified counseling: Secondary | ICD-10-CM | POA: Diagnosis not present

## 2019-05-08 DIAGNOSIS — F4323 Adjustment disorder with mixed anxiety and depressed mood: Secondary | ICD-10-CM | POA: Diagnosis not present

## 2019-05-09 ENCOUNTER — Ambulatory Visit: Payer: Self-pay | Attending: Family

## 2019-05-09 DIAGNOSIS — Z23 Encounter for immunization: Secondary | ICD-10-CM

## 2019-05-09 NOTE — Progress Notes (Signed)
   Covid-19 Vaccination Clinic  Name:  Jeffery Yates    MRN: [id number] DOB: 1980-09-05  05/09/2019  Mr. Jeffery Yates was observed post Covid-19 immunization for 15 minutes without incident. He was provided with Vaccine Information Sheet and instruction to access the V-Safe system.   Mr. Jeffery Yates was instructed to call 911 with any severe reactions post vaccine: Marland Kitchen Difficulty breathing  . Swelling of face and throat  . A fast heartbeat  . A bad rash all over body  . Dizziness and weakness   Immunizations Administered    Name Date Dose VIS Date Route   Moderna COVID-19 Vaccine 05/09/2019  2:10 PM 0.5 mL 12/2018 Intramuscular   Manufacturer: Moderna   Lot: [id number]   NDC: 00867-619-50

## 2019-05-22 DIAGNOSIS — F4323 Adjustment disorder with mixed anxiety and depressed mood: Secondary | ICD-10-CM | POA: Diagnosis not present

## 2019-05-31 NOTE — Telephone Encounter (Signed)
email sent from patient  I took my first COVID vaccine about a week ago but since then I've been extremely fatigued and sick. I'm scheduled for the second next week. Should I go forward with it? Symptom onset"It started the day after I got the shot and has only gotten better in the last couple of days but not completely . Mainly extreme fatigue where it was hard to move around and I would sleep most of the day. Hard to explain but I just felt as if I had a cold.  Hope that helps."  Dr. Isaiah Serge please advise.

## 2019-06-04 ENCOUNTER — Ambulatory Visit: Payer: Self-pay | Attending: Family

## 2019-06-04 DIAGNOSIS — Z23 Encounter for immunization: Secondary | ICD-10-CM

## 2019-06-04 NOTE — Progress Notes (Signed)
   Covid-19 Vaccination Clinic  Name:  Jeffery Yates    MRN: [id number] DOB: March 02, 1980  06/04/2019  Mr. Jeffery Yates was observed post Covid-19 immunization for 15 minutes without incident. He was provided with Vaccine Information Sheet and instruction to access the V-Safe system.   Mr. Jeffery Yates was instructed to call 911 with any severe reactions post vaccine: Marland Kitchen Difficulty breathing  . Swelling of face and throat  . A fast heartbeat  . A bad rash all over body  . Dizziness and weakness   Immunizations Administered    Name Date Dose VIS Date Route   Moderna COVID-19 Vaccine 06/04/2019  1:24 PM 0.5 mL 12/2018 Intramuscular   Manufacturer: Moderna   Lot: [id number]   NDC: 88502-774-12

## 2019-06-04 NOTE — Telephone Encounter (Signed)
If the symptoms are minor and tolerable I would advise patient to go ahead with the second dose as he would not be fully protected unless he finishes the course of vaccination.

## 2019-06-05 DIAGNOSIS — F4323 Adjustment disorder with mixed anxiety and depressed mood: Secondary | ICD-10-CM | POA: Diagnosis not present

## 2019-06-18 DIAGNOSIS — F4323 Adjustment disorder with mixed anxiety and depressed mood: Secondary | ICD-10-CM | POA: Diagnosis not present

## 2019-06-24 ENCOUNTER — Other Ambulatory Visit: Payer: Self-pay | Admitting: Pulmonary Disease

## 2019-08-07 ENCOUNTER — Encounter (HOSPITAL_COMMUNITY): Payer: Self-pay

## 2019-08-07 ENCOUNTER — Ambulatory Visit (HOSPITAL_COMMUNITY)
Admission: EM | Admit: 2019-08-07 | Discharge: 2019-08-07 | Disposition: A | Discharge status: Home / Self Care | Payer: BC Managed Care – PPO | Attending: Family Medicine | Admitting: Family Medicine

## 2019-08-07 DIAGNOSIS — N200 Calculus of kidney: Secondary | ICD-10-CM | POA: Diagnosis not present

## 2019-08-07 DIAGNOSIS — R109 Unspecified abdominal pain: Secondary | ICD-10-CM | POA: Diagnosis not present

## 2019-08-07 HISTORY — DX: Calculus of kidney: N20.0

## 2019-08-07 LAB — POCT URINALYSIS DIP (DEVICE)
Bilirubin Urine: NEGATIVE
Glucose, UA: NEGATIVE mg/dL
Ketones, ur: NEGATIVE mg/dL
Leukocytes,Ua: NEGATIVE
Nitrite: NEGATIVE
Protein, ur: 30 mg/dL — AB
Specific Gravity, Urine: 1.025 (ref 1.005–1.030)
Urobilinogen, UA: 0.2 mg/dL (ref 0.0–1.0)
pH: 5 (ref 5.0–8.0)

## 2019-08-07 MED ORDER — KETOROLAC TROMETHAMINE 60 MG/2ML IM SOLN
60.0000 mg | Freq: Once | INTRAMUSCULAR | Status: AC
  Administered 2019-08-07: 60 mg via INTRAMUSCULAR

## 2019-08-07 MED ORDER — KETOROLAC TROMETHAMINE 60 MG/2ML IM SOLN
INTRAMUSCULAR | Status: AC
  Filled 2019-08-07: qty 2

## 2019-08-07 MED ORDER — ONDANSETRON 4 MG PO TBDP
ORAL_TABLET | ORAL | Status: AC
  Filled 2019-08-07: qty 1

## 2019-08-07 MED ORDER — TAMSULOSIN HCL 0.4 MG PO CAPS: 0.4000 mg | ORAL_CAPSULE | Freq: Every day | ORAL | 0 refills | Status: DC

## 2019-08-07 MED ORDER — HYDROCODONE-ACETAMINOPHEN 5-325 MG PO TABS: 1.0000 | ORAL_TABLET | Freq: Four times a day (QID) | ORAL | 0 refills | Status: DC | PRN

## 2019-08-07 MED ORDER — ONDANSETRON 4 MG PO TBDP: 4.0000 mg | ORAL_TABLET | Freq: Three times a day (TID) | ORAL | 0 refills | Status: DC | PRN

## 2019-08-07 MED ORDER — ONDANSETRON 4 MG PO TBDP
4.0000 mg | ORAL_TABLET | Freq: Once | ORAL | Status: AC
  Administered 2019-08-07: 4 mg via ORAL

## 2019-08-07 NOTE — Discharge Instructions (Addendum)
Treating you for a kidney stone. Toradol given here for pain.  Zofran for nausea Sending prescriptions for Zofran for nausea, vomiting Flomax and hydrocodone for pain to the pharmacy. Make sure you are pushing fluids and if your symptoms worsen please go to the ER.

## 2019-08-07 NOTE — ED Provider Notes (Signed)
[facility]    CSN: [id number] Arrival date & time: 08/07/19  1157      History   Chief Complaint Chief Complaint  Patient presents with  . Flank Pain    HPI Jeffery Yates is a 39 y.o. male.   Patient is a 39 year old male past medical history of kidney stones, sarcoidosis.  He presents today with pretty severe right flank pain that started 8:00 this morning.  Feels like previous pain with kidney stones.  He has had some associated nausea.  No vomiting.  Some mild urinary retention but no burning with urination or hematuria that he has noticed.  Has taken Tylenol twice but no relief.  No fevers, chills, body aches.     Past Medical History:  Diagnosis Date  . Kidney stones   . Sarcoidosis, lung [facility])     Patient Active Problem List   Diagnosis Date Noted  . Sarcoidosis 08/11/2016    History reviewed. No pertinent surgical history.     Home Medications    Prior to Admission medications   Medication Sig Start Date End Date Taking? Authorizing Provider  albuterol (VENTOLIN HFA) 108 (90 Base) MCG/ACT inhaler Inhale 2 puffs into the lungs every 6 (six) hours as needed for wheezing or shortness of breath. 12/19/18  Yes Mannam, Praveen, MD  budesonide-formoterol (SYMBICORT) 160-4.5 MCG/ACT inhaler Inhale 2 puffs into the lungs 2 (two) times daily. 12/19/18  Yes Mannam, Praveen, MD  folic acid (FOLVITE) 1 MG tablet TAKE ONE TABLET BY MOUTH DAILY 06/25/19  Yes Mannam, Praveen, MD  methotrexate (RHEUMATREX) 2.5 MG tablet Take 8 tablets (20 mg total) by mouth once a week. Caution:Chemotherapy. Protect from light. 01/23/19  Yes Mannam, Praveen, MD  HYDROcodone-acetaminophen (NORCO/VICODIN) 5-325 MG tablet Take 1-2 tablets by mouth every 6 (six) hours as needed. 08/07/19   Dahlia Byes A, NP  ondansetron (ZOFRAN ODT) 4 MG disintegrating tablet Take 1 tablet (4 mg total) by mouth every 8 (eight) hours as needed for nausea or vomiting. 08/07/19   Dahlia Byes A, NP  tamsulosin  (FLOMAX) 0.4 MG CAPS capsule Take 1 capsule (0.4 mg total) by mouth daily. 08/07/19   Janace Aris, NP    Family History Family History  Problem Relation Age of Onset  . Thyroid disease Father     Social History Social History   Tobacco Use  . Smoking status: Never Smoker  . Smokeless tobacco: Never Used  Vaping Use  . Vaping Use: Never used  Substance Use Topics  . Alcohol use: No  . Drug use: No     Allergies   Patient has no known allergies.   Review of Systems Review of Systems   Physical Exam Triage Vital Signs ED Triage Vitals  Enc Vitals Group     BP 08/07/19 1343 (!) 107/63     Pulse Rate 08/07/19 1343 47     Resp 08/07/19 1343 18     Temp 08/07/19 1343 97.9 F (36.6 C)     Temp Source 08/07/19 1343 Oral     SpO2 08/07/19 1343 96 %     Weight --      Height --      Head Circumference --      Peak Flow --      Pain Score 08/07/19 1341 8     Pain Loc --      Pain Edu? --      Excl. in GC? --    No data found.  Updated  Vital Signs BP (!) 107/63 (BP Location: Right Arm)   Pulse 47   Temp 97.9 F (36.6 C) (Oral)   Resp 18   SpO2 96%   Visual Acuity Right Eye Distance:   Left Eye Distance:   Bilateral Distance:    Right Eye Near:   Left Eye Near:    Bilateral Near:     Physical Exam Vitals and nursing note reviewed.  Constitutional:      Appearance: Normal appearance.     Comments: Appears in pain   HENT:     Head: Normocephalic and atraumatic.     Nose: Nose normal.  Eyes:     Conjunctiva/sclera: Conjunctivae normal.  Pulmonary:     Effort: Pulmonary effort is normal.  Abdominal:     Palpations: Abdomen is soft.     Tenderness: There is no abdominal tenderness. There is no right CVA tenderness or left CVA tenderness.     Comments: No specific CVA tenderness   Musculoskeletal:        General: Normal range of motion.     Cervical back: Normal range of motion.  Skin:    General: Skin is warm and dry.  Neurological:      Mental Status: He is alert.  Psychiatric:        Mood and Affect: Mood normal.      UC Treatments / Results  Labs (all labs ordered are listed, but only abnormal results are displayed) Labs Reviewed  POCT URINALYSIS DIP (DEVICE) - Abnormal; Notable for the following components:      Result Value   Hgb urine dipstick LARGE (*)    Protein, ur 30 (*)    All other components within normal limits    EKG   Radiology No results found.  Procedures Procedures (including critical care time)  Medications Ordered in UC Medications  ketorolac (TORADOL) injection 60 mg (60 mg Intramuscular Given 08/07/19 1404)  ondansetron (ZOFRAN-ODT) disintegrating tablet 4 mg (4 mg Oral Given 08/07/19 1403)    Initial Impression / Assessment and Plan / UC Course  I have reviewed the triage vital signs and the nursing notes.  Pertinent labs & imaging results that were available during my care of the patient were reviewed by me and considered in my medical decision making (see chart for details).     Nephrolithiasis Most likely diagnosis based on large blood in urine and flank pain Patient has history of kidney stones.  Toradol given here for pain.  Zofran for nausea, vomiting Patient reassessed and pain decreased after the Toradol. Will prescribe Flomax, Zofran as needed for nausea, vomiting and hydrocodone for pain Recommended push fluids and recommend if symptoms worsen to go to the ER.  Final Clinical Impressions(s) / UC Diagnoses   Final diagnoses:  Nephrolithiasis     Discharge Instructions     Treating you for a kidney stone. Toradol given here for pain.  Zofran for nausea Sending prescriptions for Zofran for nausea, vomiting Flomax and hydrocodone for pain to the pharmacy. Make sure you are pushing fluids and if your symptoms worsen please go to the ER.    ED Prescriptions    Medication Sig Dispense Auth. Provider   ondansetron (ZOFRAN ODT) 4 MG disintegrating tablet Take 1  tablet (4 mg total) by mouth every 8 (eight) hours as needed for nausea or vomiting. 20 tablet Teigan Sahli A, NP   tamsulosin (FLOMAX) 0.4 MG CAPS capsule Take 1 capsule (0.4 mg total) by mouth daily. 30 capsule  Jerry Haugen A, NP   HYDROcodone-acetaminophen (NORCO/VICODIN) 5-325 MG tablet Take 1-2 tablets by mouth every 6 (six) hours as needed. 12 tablet Kara Melching A, NP     I have reviewed the PDMP during this encounter.   Dahlia Byes A, NP 08/07/19 1503

## 2019-08-07 NOTE — ED Triage Notes (Signed)
Pt presents with immense intermittent pain in R flank since 0800.  Feels like kidney stones he has had previously.  No hematuria noticed.  Reports some nausea with the waves of pain.  Took Tylenol at 0800 and again at 1130.  Does not help.

## 2019-08-27 ENCOUNTER — Other Ambulatory Visit: Payer: Self-pay | Admitting: Pulmonary Disease

## 2019-09-24 DIAGNOSIS — Z03818 Encounter for observation for suspected exposure to other biological agents ruled out: Secondary | ICD-10-CM | POA: Diagnosis not present

## 2019-10-15 DIAGNOSIS — Z03818 Encounter for observation for suspected exposure to other biological agents ruled out: Secondary | ICD-10-CM | POA: Diagnosis not present

## 2019-12-26 ENCOUNTER — Ambulatory Visit (INDEPENDENT_AMBULATORY_CARE_PROVIDER_SITE_OTHER): Payer: BC Managed Care – PPO | Admitting: Family Medicine

## 2019-12-26 ENCOUNTER — Ambulatory Visit: Payer: BLUE CROSS/BLUE SHIELD | Admitting: Family Medicine

## 2019-12-26 ENCOUNTER — Other Ambulatory Visit: Payer: Self-pay

## 2019-12-26 DIAGNOSIS — Z23 Encounter for immunization: Secondary | ICD-10-CM

## 2020-01-13 DIAGNOSIS — Z03818 Encounter for observation for suspected exposure to other biological agents ruled out: Secondary | ICD-10-CM | POA: Diagnosis not present

## 2020-01-30 ENCOUNTER — Other Ambulatory Visit: Payer: Self-pay | Admitting: Pulmonary Disease

## 2020-02-03 ENCOUNTER — Telehealth: Payer: Self-pay | Admitting: Pulmonary Disease

## 2020-02-03 ENCOUNTER — Other Ambulatory Visit: Payer: Self-pay | Admitting: Pulmonary Disease

## 2020-02-03 NOTE — Telephone Encounter (Signed)
Dr. Isaiah Serge please advise on refill of medication as patient has a follow up with you on 03/18/20 but hasn't been seen in office since before December 2020

## 2020-02-03 NOTE — Telephone Encounter (Signed)
02/03/20  Patient is overdue for follow-up with our office.  Last seen December/2020 via virtual visit with Dr. Isaiah Serge.  Patient needs scheduled follow-up at next available appointment slot with Dr. Isaiah Serge before additional refills of methotrexate can be prescribed.  Left voicemail for patient to schedule follow-up with our office.  Elisha Headland, FNP

## 2020-02-06 NOTE — Telephone Encounter (Signed)
Ok to refill methotrexate for 3 months till he can be seen in clinic again

## 2020-02-06 NOTE — Telephone Encounter (Signed)
ATC patient to let him know that methotrexate was refilled and sent to preferred pharmacy. Per DPR left detailed message. Nothing further needed at this time.

## 2020-03-18 ENCOUNTER — Ambulatory Visit: Payer: BC Managed Care – PPO | Admitting: Pulmonary Disease

## 2020-03-18 ENCOUNTER — Encounter: Payer: Self-pay | Admitting: Pulmonary Disease

## 2020-03-18 ENCOUNTER — Other Ambulatory Visit: Payer: Self-pay

## 2020-03-18 ENCOUNTER — Encounter: Payer: Self-pay | Admitting: *Deleted

## 2020-03-18 VITALS — BP 112/68 | HR 59 | Temp 97.2°F | Ht 68.0 in | Wt 203.2 lb

## 2020-03-18 DIAGNOSIS — D869 Sarcoidosis, unspecified: Secondary | ICD-10-CM | POA: Diagnosis not present

## 2020-03-18 LAB — CBC WITH DIFFERENTIAL/PLATELET
Basophils Absolute: 0.1 10*3/uL (ref 0.0–0.1)
Basophils Relative: 1.1 % (ref 0.0–3.0)
Eosinophils Absolute: 0.3 10*3/uL (ref 0.0–0.7)
Eosinophils Relative: 4 % (ref 0.0–5.0)
HCT: 34.8 % — ABNORMAL LOW (ref 39.0–52.0)
Hemoglobin: 12 g/dL — ABNORMAL LOW (ref 13.0–17.0)
Lymphocytes Relative: 33.6 % (ref 12.0–46.0)
Lymphs Abs: 2.2 10*3/uL (ref 0.7–4.0)
MCHC: 34.7 g/dL (ref 30.0–36.0)
MCV: 88.9 fl (ref 78.0–100.0)
Monocytes Absolute: 0.8 10*3/uL (ref 0.1–1.0)
Monocytes Relative: 11.8 % (ref 3.0–12.0)
Neutro Abs: 3.2 10*3/uL (ref 1.4–7.7)
Neutrophils Relative %: 49.5 % (ref 43.0–77.0)
Platelets: 224 10*3/uL (ref 150.0–400.0)
RBC: 3.91 Mil/uL — ABNORMAL LOW (ref 4.22–5.81)
RDW: 13.5 % (ref 11.5–15.5)
WBC: 6.6 10*3/uL (ref 4.0–10.5)

## 2020-03-18 LAB — COMPREHENSIVE METABOLIC PANEL
ALT: 38 U/L (ref 0–53)
AST: 26 U/L (ref 0–37)
Albumin: 3.9 g/dL (ref 3.5–5.2)
Alkaline Phosphatase: 75 U/L (ref 39–117)
BUN: 19 mg/dL (ref 6–23)
CO2: 26 mEq/L (ref 19–32)
Calcium: 9.1 mg/dL (ref 8.4–10.5)
Chloride: 106 mEq/L (ref 96–112)
Creatinine, Ser: 1.3 mg/dL (ref 0.40–1.50)
GFR: 67.98 mL/min (ref >60.00)
Glucose, Bld: 86 mg/dL (ref 70–99)
Potassium: 4 mEq/L (ref 3.5–5.1)
Sodium: 138 mEq/L (ref 135–145)
Total Bilirubin: 0.8 mg/dL (ref 0.2–1.2)
Total Protein: 8 g/dL (ref 6.0–8.3)

## 2020-03-18 NOTE — Patient Instructions (Signed)
Glad you are back in the clinic We need reevaluation with comprehensive metabolic panel, CBC Schedule high-res CT and PFTs  Continue the methotrexate and folic acid Follow-up in 1-2 months

## 2020-03-18 NOTE — Progress Notes (Signed)
Jeffery Yates    [id number]    08-18-1980  Primary Care Physician:Patient, No Pcp Per  Referring Physician: No referring provider defined for this encounter.  Chief complaint: Follow-up for sarcoidosis  HPI: 40 year old with sarcoidosis. He was initially diagnosed around 2005 in [place] and found to have stomach involvement of sarcoidosis on EGD with biopsy.  He was previously followed at [facility].  As per notes he has involvement of stomach, liver, pulmonary.  He underwent a liver biopsy in December 2017 which showed hepatic sarcoid.  Treated initially with prednisone which had to be stopped due to side effects.  He has been maintained on methotrexate and folic acid.  Transferred care to [facility] since it is closer to his home.  Chief complaint is chronic fatigue which is unchanged from baseline.  He has joint pain and stiffness.  Symptoms of snoring at night.  Denies daytime sleepiness.  Reports having a kidney stone earlier this year.  He has followed up with Dr. Nickola Major, rheumatology.  Review of her notes indicate that methotrexate was increased to 20 mg a week in late 2019/early 2020 due to fatigue, muscle ache Reviewed notes from [facility] rheumatology, Dr. Nickola Major dated 04/02/2018 and 06/11/18 Recommended methotrexate 20 mg a day and folic acid.  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  He is kept away due to the COVID-19 pandemic States that he is compliant with methotrexate except for a break of 2 weeks over the past 2 years  States that dyspnea is stable but has increasing fatigue, aches   Outpatient Encounter Medications as of 03/18/2020  Medication Sig  . albuterol (VENTOLIN HFA) 108 (90 Base) MCG/ACT inhaler Inhale 2 puffs into the lungs every 6 (six) hours as needed for  wheezing or shortness of breath.  . folic acid (FOLVITE) 1 MG tablet TAKE ONE TABLET BY MOUTH DAILY *NEED OV FOR FURTHER REFILLS*  . methotrexate 2.5 MG tablet TAKE 8 TABLETS (20 MG TOTAL) BY MOUTH ONCE PER WEEK. PRECAUTION: CHEMO  . budesonide-formoterol (SYMBICORT) 160-4.5 MCG/ACT inhaler Inhale 2 puffs into the lungs 2 (two) times daily. (Patient not taking: Reported on 03/18/2020)  . HYDROcodone-acetaminophen (NORCO/VICODIN) 5-325 MG tablet Take 1-2 tablets by mouth every 6 (six) hours as needed. (Patient not taking: Reported on 03/18/2020)  . ondansetron (ZOFRAN ODT) 4 MG disintegrating tablet Take 1 tablet (4 mg total) by mouth every 8 (eight) hours as needed for nausea or vomiting. (Patient not taking: Reported on 03/18/2020)  . tamsulosin (FLOMAX) 0.4 MG CAPS capsule Take 1 capsule (0.4 mg total) by mouth daily. (Patient not taking: Reported on 03/18/2020)   No facility-administered encounter medications on file as of 03/18/2020.    Allergies as of 03/18/2020  . (No Known Allergies)   Physical Exam: Blood pressure 112/68, pulse (!) 59, temperature (!) 97.2 F (36.2 C), temperature source Temporal, height 5\' 8"  (1.727 m), weight 203 lb 3.2 oz (92.2 kg), SpO2 97 %. Gen:      No acute distress HEENT:  EOMI, sclera anicteric Neck:     No masses; no thyromegaly Lungs:    Clear to auscultation bilaterally; normal respiratory effort CV:         Regular rate and rhythm; no murmurs Abd:      + bowel sounds; soft, non-tender; no palpable  masses, no distension Ext:    No edema; adequate peripheral perfusion Skin:      Warm and dry; no rash Neuro: alert and oriented x 3 Psych: normal mood and affect  Data Reviewed: Chest x-ray 08/11/16- Bilateral interstitial opacities consistent with sarcoid.  CT high-resolution 06/15/2017-nodular thickening of the fissures, bronchiectasis, bronchiolectasis with enlarged mediastinal lymph nodes.  Moderate air-trapping  Chest x-ray 12/19/2018-stable interstitial  changes.  I have reviewed the images of personally.  PFTs 06/08/1988 FVC 4.16 [99%], FEV1 3.6 (105%), F/F 87, TLC 85%, DLCO 81% Normal study  Labs  Comprehensive metabolic panel 12/19/2018-significant for ALT 55, CBC stable ANA, angiotensin-converting enzyme, CCP, rheumatoid factor 12/15/2016 -normal   EKG 06/14/2017-sinus bradycardia.  No acute ST-T changes.  Sleep: Sleep study 07/05/2017 AHI 0.5 with O2 sats of 90%  Assessment:  Sarcoidosis with multisystem involvement Has involvement of joints, liver and lungs.  Maintained on methotrexate for many years with stable symptoms  He was previously noncompliant with methotrexate in 2019 to 2020 but has been taking it regularly now Need reassessment given symptoms of increasing fatigue and muscle ache Check metabolic panel, hepatic panel, CBC, PFTs and high-res CT  I have asked him to follow back with Dr. Nickola Major.  Health maintenance 10/18/2013-Pneumovax  Plan/Recommendations: - Continue methotrexate, folic acid - Labs, high-res CT, PFTs - Follow back with rheumatology  Chilton Greathouse MD [facility] 03/18/2020, 8:59 AM  CC: No ref. provider found

## 2020-03-18 NOTE — Addendum Note (Signed)
Addended by: Demetrio Lapping E on: 03/18/2020 09:26 AM   Modules accepted: Orders

## 2020-03-27 ENCOUNTER — Ambulatory Visit (INDEPENDENT_AMBULATORY_CARE_PROVIDER_SITE_OTHER)
Admission: RE | Admit: 2020-03-27 | Discharge: 2020-03-27 | Disposition: A | Discharge status: Home / Self Care | Payer: BC Managed Care – PPO | Source: Ambulatory Visit | Attending: Pulmonary Disease | Admitting: Pulmonary Disease

## 2020-03-27 ENCOUNTER — Other Ambulatory Visit: Payer: Self-pay

## 2020-03-27 DIAGNOSIS — R918 Other nonspecific abnormal finding of lung field: Secondary | ICD-10-CM | POA: Diagnosis not present

## 2020-03-27 DIAGNOSIS — D869 Sarcoidosis, unspecified: Secondary | ICD-10-CM

## 2020-03-27 DIAGNOSIS — J479 Bronchiectasis, uncomplicated: Secondary | ICD-10-CM | POA: Diagnosis not present

## 2020-04-27 ENCOUNTER — Other Ambulatory Visit (HOSPITAL_COMMUNITY)
Admission: RE | Admit: 2020-04-27 | Discharge: 2020-04-27 | Disposition: A | Discharge status: Home / Self Care | Payer: BC Managed Care – PPO | Source: Ambulatory Visit | Attending: Pulmonary Disease | Admitting: Pulmonary Disease

## 2020-04-27 DIAGNOSIS — Z01812 Encounter for preprocedural laboratory examination: Secondary | ICD-10-CM | POA: Insufficient documentation

## 2020-04-27 DIAGNOSIS — Z20822 Contact with and (suspected) exposure to covid-19: Secondary | ICD-10-CM | POA: Diagnosis not present

## 2020-04-27 LAB — SARS CORONAVIRUS 2 (TAT 6-24 HRS): SARS Coronavirus 2: NEGATIVE

## 2020-04-30 ENCOUNTER — Other Ambulatory Visit: Payer: Self-pay

## 2020-04-30 ENCOUNTER — Encounter: Payer: Self-pay | Admitting: Pulmonary Disease

## 2020-04-30 ENCOUNTER — Ambulatory Visit (INDEPENDENT_AMBULATORY_CARE_PROVIDER_SITE_OTHER): Payer: BC Managed Care – PPO | Admitting: Pulmonary Disease

## 2020-04-30 ENCOUNTER — Ambulatory Visit: Payer: BC Managed Care – PPO | Admitting: Pulmonary Disease

## 2020-04-30 VITALS — BP 118/66 | HR 66 | Temp 97.4°F | Ht 68.0 in | Wt 202.2 lb

## 2020-04-30 DIAGNOSIS — D869 Sarcoidosis, unspecified: Secondary | ICD-10-CM | POA: Diagnosis not present

## 2020-04-30 DIAGNOSIS — R0602 Shortness of breath: Secondary | ICD-10-CM | POA: Diagnosis not present

## 2020-04-30 LAB — PULMONARY FUNCTION TEST
DL/VA % pred: 90 %
DL/VA: 4.21 ml/min/mmHg/L
DLCO cor % pred: 82 %
DLCO cor: 24.06 ml/min/mmHg
DLCO unc % pred: 82 %
DLCO unc: 24.06 ml/min/mmHg
FEF 25-75 Post: 4.36 L/sec
FEF 25-75 Pre: 3.98 L/sec
FEF2575-%Change-Post: 9 %
FEF2575-%Pred-Post: 123 %
FEF2575-%Pred-Pre: 112 %
FEV1-%Change-Post: 1 %
FEV1-%Pred-Post: 105 %
FEV1-%Pred-Pre: 103 %
FEV1-Post: 3.54 L
FEV1-Pre: 3.49 L
FEV1FVC-%Change-Post: 2 %
FEV1FVC-%Pred-Pre: 101 %
FEV6-%Change-Post: -1 %
FEV6-%Pred-Post: 102 %
FEV6-%Pred-Pre: 103 %
FEV6-Post: 4.15 L
FEV6-Pre: 4.19 L
FEV6FVC-%Change-Post: 0 %
FEV6FVC-%Pred-Post: 102 %
FEV6FVC-%Pred-Pre: 101 %
FVC-%Change-Post: -1 %
FVC-%Pred-Post: 100 %
FVC-%Pred-Pre: 101 %
FVC-Post: 4.15 L
FVC-Pre: 4.2 L
Post FEV1/FVC ratio: 85 %
Post FEV6/FVC ratio: 100 %
Pre FEV1/FVC ratio: 83 %
Pre FEV6/FVC Ratio: 100 %
RV % pred: 108 %
RV: 1.94 L
TLC % pred: 94 %
TLC: 6.25 L

## 2020-04-30 LAB — TROPONIN I (HIGH SENSITIVITY): High Sens Troponin I: 3 ng/L (ref 2–17)

## 2020-04-30 LAB — CREATININE KINASE MB: CK-MB: 1.9 ng/mL (ref 0.3–4.0)

## 2020-04-30 LAB — CK: Total CK: 178 U/L (ref 7–232)

## 2020-04-30 NOTE — Progress Notes (Signed)
Jeffery Yates    [id number]    04-05-1980  Primary Care Physician:Patient, No Pcp Per (Inactive)  Referring Physician: No referring provider defined for this encounter.  Chief complaint: Follow-up for sarcoidosis  HPI: 40 year old with sarcoidosis. He was initially diagnosed around 2005 in [place] and found to have stomach involvement of sarcoidosis on EGD with biopsy.  He was previously followed at [facility].  As per notes he has involvement of stomach, liver, pulmonary.  He underwent a liver biopsy in December 2017 which showed hepatic sarcoid.  Treated initially with prednisone which had to be stopped due to side effects.  He has been maintained on methotrexate and folic acid.  Transferred care to [facility] since it is closer to his home.  He has followed up with Dr. Nickola Major, rheumatology.   He was previously noncompliant with methotrexate in 2019 Review of Rheumatology notes indicate that methotrexate was increased to 20 mg a week in late 2019/early 2020 due to fatigue, muscle ache Reviewed notes from [facility] rheumatology, Dr. Nickola Major dated 04/02/2018 and 06/11/18 Recommended methotrexate 20 mg a day and folic acid.  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  Continues to have fatigue  Here for review of CT and PFTs and labs that he got recently  Outpatient Encounter Medications as of 04/30/2020  Medication Sig  . albuterol (VENTOLIN HFA) 108 (90 Base) MCG/ACT inhaler Inhale 2 puffs into the lungs every 6 (six) hours as needed for wheezing or shortness of breath.  . folic acid (FOLVITE) 1 MG tablet TAKE ONE TABLET BY MOUTH DAILY *NEED OV FOR FURTHER REFILLS*  . methotrexate 2.5 MG tablet TAKE 8 TABLETS (20 MG TOTAL) BY MOUTH ONCE PER WEEK. PRECAUTION: CHEMO  . [DISCONTINUED]  HYDROcodone-acetaminophen (NORCO/VICODIN) 5-325 MG tablet Take 1-2 tablets by mouth every 6 (six) hours as needed.  . budesonide-formoterol (SYMBICORT) 160-4.5 MCG/ACT inhaler Inhale 2 puffs into the lungs 2 (two) times daily. (Patient not taking: Reported on 04/30/2020)  . ondansetron (ZOFRAN ODT) 4 MG disintegrating tablet Take 1 tablet (4 mg total) by mouth every 8 (eight) hours as needed for nausea or vomiting. (Patient not taking: Reported on 04/30/2020)  . tamsulosin (FLOMAX) 0.4 MG CAPS capsule Take 1 capsule (0.4 mg total) by mouth daily. (Patient not taking: Reported on 04/30/2020)   No facility-administered encounter medications on file as of 04/30/2020.    Allergies as of 04/30/2020  . (No Known Allergies)   Physical Exam: Blood pressure 118/66, pulse 66, temperature (!) 97.4 F (36.3 C), temperature source Temporal, height 5\' 8"  (1.727 m), weight 202 lb 3.2 oz (91.7 kg), SpO2 99 %. Gen:      No acute distress HEENT:  EOMI, sclera anicteric Neck:     No masses; no thyromegaly Lungs:    Clear to auscultation bilaterally; normal respiratory effort CV:         Regular rate and rhythm; no murmurs Abd:      + bowel sounds; soft, non-tender; no palpable masses, no distension Ext:    No edema; adequate peripheral perfusion Skin:      Warm and dry; no rash Neuro: alert and oriented x 3 Psych: normal mood and affect  Data Reviewed: Imaging: Chest x-ray 08/11/16- Bilateral interstitial opacities consistent with sarcoid.  CT high-resolution 06/15/2017-nodular thickening of the fissures, bronchiectasis, bronchiolectasis with enlarged mediastinal  lymph nodes.  Moderate air-trapping  High-res CT 03/27/2020-unchanged perilymphatic nodularity.  Stable bronchiectasis in the right middle lung, air trapping. I have reviewed the images of personally.  PFTs  06/08/2017 FVC 4.16 [99%], FEV1 3.6 (105%), F/F 87, TLC 85%, DLCO 81% Normal study  04/30/2020 FVC 4.15 [100%], FEV1 3.54 [105%], F/F 85, TLC  6.25 [94%], DLCO 24.06 [82%] Normal test  Labs  ANA, angiotensin-converting enzyme, CCP, rheumatoid factor 12/15/2016 -normal   Comprehensive metabolic panel 03/18/2020-normal CBC 03/18/2020- hemoglobin is stable at 12  EKG 06/14/2017-sinus bradycardia.  No acute ST-T changes.  Sleep: Sleep study 07/05/2017 AHI 0.5 with O2 sats of 90%  Assessment:  Sarcoidosis with multisystem involvement Has involvement of joints, liver and lungs.  Maintained on methotrexate for many years  Reevaluation with CT shows minimal nodularity in the lung with normal PFTs He has normal metabolic panel, hepatic panel, CBC.  Check CK CK-MB, troponin, proBNP He may need to follow back with Dr. Nickola Major to see if he needs additional medication for systemic symptoms. Refer to cardiology for evaluation of persistent fatigue.  He has routine follow-up follow-up  Health maintenance 10/18/2013-Pneumovax  Plan/Recommendations: Continue methotrexate Check labs Cardiology evaluation  Chilton Greathouse MD [place] [facility] 04/30/2020, 10:00 AM  CC: No ref. provider found

## 2020-04-30 NOTE — Patient Instructions (Addendum)
We will check an EKG today Check additional labs including CK, CK-MB, troponin, proBNP for dyspnea Referral to cardiology for evaluation of cardiac sarcoid, fatigue  Follow-up in 3 to 4 months.

## 2020-04-30 NOTE — Progress Notes (Signed)
Full PFT completed today ? ?

## 2020-05-01 LAB — PRO B NATRIURETIC PEPTIDE: NT-Pro BNP: 14 pg/mL (ref 0–86)

## 2020-05-13 ENCOUNTER — Encounter: Payer: Self-pay | Admitting: *Deleted

## 2020-05-17 ENCOUNTER — Other Ambulatory Visit: Payer: Self-pay | Admitting: Pulmonary Disease

## 2020-05-21 DIAGNOSIS — D860 Sarcoidosis of lung: Secondary | ICD-10-CM | POA: Insufficient documentation

## 2020-05-21 DIAGNOSIS — N200 Calculus of kidney: Secondary | ICD-10-CM | POA: Insufficient documentation

## 2020-05-26 ENCOUNTER — Ambulatory Visit: Payer: BC Managed Care – PPO | Admitting: Cardiology

## 2020-05-26 ENCOUNTER — Encounter: Payer: Self-pay | Admitting: Cardiology

## 2020-05-26 ENCOUNTER — Other Ambulatory Visit: Payer: Self-pay

## 2020-05-26 ENCOUNTER — Ambulatory Visit (INDEPENDENT_AMBULATORY_CARE_PROVIDER_SITE_OTHER): Payer: BC Managed Care – PPO

## 2020-05-26 VITALS — BP 108/68 | HR 77 | Ht 68.0 in | Wt 204.0 lb

## 2020-05-26 DIAGNOSIS — R0609 Other forms of dyspnea: Secondary | ICD-10-CM | POA: Insufficient documentation

## 2020-05-26 DIAGNOSIS — D860 Sarcoidosis of lung: Secondary | ICD-10-CM

## 2020-05-26 DIAGNOSIS — R0600 Dyspnea, unspecified: Secondary | ICD-10-CM

## 2020-05-26 DIAGNOSIS — D869 Sarcoidosis, unspecified: Secondary | ICD-10-CM | POA: Diagnosis not present

## 2020-05-26 DIAGNOSIS — R5383 Other fatigue: Secondary | ICD-10-CM | POA: Diagnosis not present

## 2020-05-26 NOTE — Progress Notes (Signed)
g

## 2020-05-26 NOTE — Progress Notes (Signed)
Cardiology Consultation:    Date:  05/26/2020   ID:  Jeffery Yates, DOB 12/10/1980, MRN [id number]  PCP:  Patient, No Pcp Per (Inactive)  Cardiologist:  Gypsy Balsam, MD   Referring MD: Chilton Greathouse, MD   Chief Complaint  Patient presents with  . Hx of Sacoidosis     SOB    History of Present Illness:    Jeffery Yates is a 40 y.o. male who is being seen today for the evaluation of sarcoidosis at the request of Chilton Greathouse, MD.  Initially diagnosed with sarcoidosis in 2005 in [place] apparently he was find to have involvement of stomach as well as esophagus it was proven by EGD and biopsy.  He has been followed by rheumatology and pulmonary [facility] according to the notes he does have involvement of stomach liver as well as pulmonary.  He did have liver biopsy in 2017 which showed hepatic sarcoidosis he has been maintained on methotrexate as well as folic acid.  He transfer his care to [facility] since this is closer to his home now.  He was seen by pulmonary evaluated for pulmonary sarcoidosis.  He was referred to [place] for evaluation of shortness of breath fatigue and tiredness.  It appears to be disproportional to the degree of sarcoidosis we see on pulmonary evaluation. He tells me that he gets tired very easily.  He used to be active and try to walk on the regular basis now to have days that he cannot do much.  Shortness of breath is there as well.  Denies have any chest pain tightness squeezing pressure burning chest there is no palpitation dizziness, however, he tells me years ago he got episodes of dizziness however did not completely passed out.  Nothing recently. No swelling of lower extremities.  No palpitations no paroxysmal nocturnal dyspnea.  Past Medical History:  Diagnosis Date  . Anxiety 10/21/2014   Last Assessment & Plan:  Formatting of this note might be different from the original. Anxiety and intrusive thoughts does sound c/w poss OCD spectrum type disorder.   Not impacting functioning significantly (ie is able to work and take care of his children) but does impact relationship with gf at this time and has been a pattern.  No commands/hallucinations.  Does report some other compulsions as   . Increased frequency of urination 11/07/2013   Last Assessment & Plan:  Formatting of this note might be different from the original. Isolated frequency without dysuria or LUTS symptoms, no penile discharge, nocturia is inconsistent and at most x 1/night. Most likely caffeine induced. - encouraged reduction of caffeine and beverage intake esp in evenings - continue to monitor - if no improvement, will do prostate exam, UA  . Kidney stones   . Sarcoidosis 11/07/2013   Dx 2003 [place] dx by [facility] intolerant to high dose steroids/ main symptoms = fatigue/ doe/ min cough  - 04/25/13  FVC 4.16 = 101%  - 04/30/15  FVC 4.2 = 107% - Spirometry 05/24/16   FEV1 1.15  (40%)  Ratio 73 with non-physiologic f/v loop  With FVC 1.58  - 08/11/2016 1st [facility] on MTX 15 mg per week  Last Assessment & Plan:  Formatting of this note might be different from the original. Sx a  . Sarcoidosis, lung (HCC)   . Skin lesion of right leg 10/21/2014   Last Assessment & Plan:  Formatting of this note might be different from the original. Appears most c/w SK v AK v verruca given  stuck on appearance but with sig secondary changes difficult to assess pigmentation - would recommend shave biopsy at some point but currently is connected with his anxiety/intrusive thoughts so it is ok to hold off for today, will recheck for changes in 1 mo  . Well adult health check 10/18/2013    Past Surgical History:  Procedure Laterality Date  . LIVER BIOPSY      Current Medications: Current Meds  Medication Sig  . budesonide-formoterol (SYMBICORT) 160-4.5 MCG/ACT inhaler Inhale 2 puffs into the lungs 2 (two) times daily.  . folic acid (FOLVITE) 1 MG tablet TAKE ONE TABLET BY MOUTH DAILY *NEED OV FOR FURTHER  REFILLS* (Patient taking differently: Take 1 mg by mouth daily.)  . methotrexate 2.5 MG tablet TAKE 8 TABLETS BY MOUTH ONCE A WEEK. PRECAUTION: CHEMO (Patient taking differently: Take 2.5 mg by mouth once a week.)  . [DISCONTINUED] albuterol (VENTOLIN HFA) 108 (90 Base) MCG/ACT inhaler Inhale 2 puffs into the lungs every 6 (six) hours as needed for wheezing or shortness of breath.     Allergies:   Patient has no known allergies.   Social History   Socioeconomic History  . Marital status: Married    Spouse name: Not on file  . Number of children: Not on file  . Years of education: Not on file  . Highest education level: Not on file  Occupational History  . Not on file  Tobacco Use  . Smoking status: Never Smoker  . Smokeless tobacco: Never Used  Vaping Use  . Vaping Use: Never used  Substance and Sexual Activity  . Alcohol use: No  . Drug use: No  . Sexual activity: Not Currently  Other Topics Concern  . Not on file  Social History Narrative  . Not on file   Social Determinants of Health   Financial Resource Strain: Not on file  Food Insecurity: Not on file  Transportation Needs: Not on file  Physical Activity: Not on file  Stress: Not on file  Social Connections: Not on file     Family History: The patient's family history includes Thyroid disease in his father. ROS:   Please see the history of present illness.    All 14 point review of systems negative except as described per history of present illness.  EKGs/Labs/Other Studies Reviewed:    The following studies were reviewed today:   EKG:  EKG is  ordered today.  The ekg ordered today demonstrates normal sinus rhythm with sinus arrhythmia, normal QS complex duration morphology normal P interval nonspecific ST segment changes  Recent Labs: 03/18/2020: ALT 38; BUN 19; Creatinine, Ser 1.30; Hemoglobin 12.0; Platelets 224.0; Potassium 4.0; Sodium 138 04/30/2020: NT-Pro BNP 14  Recent Lipid Panel No results found  for: CHOL, TRIG, HDL, CHOLHDL, VLDL, LDLCALC, LDLDIRECT  Physical Exam:    VS:  BP 108/68 (BP Location: Right Arm, Patient Position: Sitting)   Pulse 77   Ht 5\' 8"  (1.727 m)   Wt 204 lb (92.5 kg)   SpO2 94%   BMI 31.02 kg/m     Wt Readings from Last 3 Encounters:  05/26/20 204 lb (92.5 kg)  04/30/20 202 lb 3.2 oz (91.7 kg)  03/18/20 203 lb 3.2 oz (92.2 kg)     GEN:  Well nourished, well developed in no acute distress HEENT: Normal NECK: No JVD; No carotid bruits LYMPHATICS: No lymphadenopathy CARDIAC: RRR, no murmurs, no rubs, no gallops RESPIRATORY:  Clear to auscultation without rales, wheezing or rhonchi  ABDOMEN:  Soft, non-tender, non-distended MUSCULOSKELETAL:  No edema; No deformity  SKIN: Warm and dry NEUROLOGIC:  Alert and oriented x 3 PSYCHIATRIC:  Normal affect   ASSESSMENT:    1. Sarcoidosis, lung (HCC)   2. Dyspnea on exertion   3. Sarcoidosis   4. Fatigue, unspecified type    PLAN:    In order of problems listed above:  1. Sarcoidosis with involvement of lung, liver, stomach.  We will look for potential cardiac involvement of sarcoidosis.  We will start with echocardiogram if echocardiogram will be unrevealing then MRI of the heart will be done.  As a part of evaluation he will have monitoring done this is to look for any significant arrhythmia or AV block.  Does not appear to have significant problem based on symptomatology but that is not enough to rule it out.  In terms of potential evaluation for coronary artery disease I will not initiate this right now but if we do work-up including MRI which will be unrevealing then evaluation for coronary artery disease will be warranted. 2. Dyspnea on exertion multifactorial obviously sarcoidosis play some role here however pulmonary function test and pulmonary study did not show clear explanation for his shortness of breath.  This is the reason why he is here in my office to be evaluated for cardiac reason for  shortness of breath we will initiate with assessment of valvular function as well as muscle function and then will look potentially for ischemic cause of his symptomatology though he does not have any chest pain. 3. Cholesterol status unknown we will schedule him to have fasting lipid profile done. 4. Fatigue and tiredness multifactorial chronic inflammatory process like sarcoidosis play some role here he tells me clearly that if his sarcoid is acting up he is feeling worse.  That being followed by rheumatology.   Medication Adjustments/Labs and Tests Ordered: Current medicines are reviewed at length with the patient today.  Concerns regarding medicines are outlined above.  Orders Placed This Encounter  Procedures  . Lipid panel  . LONG TERM MONITOR (3-14 DAYS)  . EKG 12-Lead  . ECHOCARDIOGRAM COMPLETE   No orders of the defined types were placed in this encounter.   Signed, Georgeanna Lea, MD, [facility]. 05/26/2020 3:46 PM    [facility]

## 2020-05-26 NOTE — Patient Instructions (Signed)
Medication Instructions:  Your physician recommends that you continue on your current medications as directed. Please refer to the Current Medication list given to you today.  *If you need a refill on your cardiac medications before your next appointment, please call your pharmacy*   Lab Work: Your physician recommends that you return for lab work when fasting: lipid  If you have labs (blood work) drawn today and your tests are completely normal, you will receive your results only by: Marland Kitchen MyChart Message (if you have MyChart) OR . A paper copy in the mail If you have any lab test that is abnormal or we need to change your treatment, we will call you to review the results.   Testing/Procedures: Your physician has requested that you have an echocardiogram. Echocardiography is a painless test that uses sound waves to create images of your heart. It provides your doctor with information about the size and shape of your heart and how well your heart's chambers and valves are working. This procedure takes approximately one hour. There are no restrictions for this procedure.  A zio monitor was ordered today. It will remain on for 7 days. You will then return monitor and event diary in provided box. It takes 1-2 weeks for report to be downloaded and returned to [place]. We will call you with the results. If monitor falls off or has orange flashing light, please call Zio for further instructions.      Follow-Up: At [facility], you and your health needs are our priority.  As part of our continuing mission to provide you with exceptional heart care, we have created designated Provider Care Teams.  These Care Teams include your primary Cardiologist (physician) and Advanced Practice Providers (APPs -  Physician Assistants and Nurse Practitioners) who all work together to provide you with the care you need, when you need it.  We recommend signing up for the patient portal called "MyChart".  Sign up  information is provided on this After Visit Summary.  MyChart is used to connect with patients for Virtual Visits (Telemedicine).  Patients are able to view lab/test results, encounter notes, upcoming appointments, etc.  Non-urgent messages can be sent to your provider as well.   To learn more about what you can do with MyChart, go to [email or web address].    Your next appointment:   6 week(s)  The format for your next appointment:   In Person  Provider:   Gypsy Balsam, MD   Other Instructions   Echocardiogram An echocardiogram is a test that uses sound waves (ultrasound) to produce images of the heart. Images from an echocardiogram can provide important information about:  Heart size and shape.  The size and thickness and movement of your heart's walls.  Heart muscle function and strength.  Heart valve function or if you have stenosis. Stenosis is when the heart valves are too narrow.  If blood is flowing backward through the heart valves (regurgitation).  A tumor or infectious growth around the heart valves.  Areas of heart muscle that are not working well because of poor blood flow or injury from a heart attack.  Aneurysm detection. An aneurysm is a weak or damaged part of an artery wall. The wall bulges out from the normal force of blood pumping through the body. Tell a health care provider about:  Any allergies you have.  All medicines you are taking, including vitamins, herbs, eye drops, creams, and over-the-counter medicines.  Any blood disorders you have.  Any surgeries  you have had.  Any medical conditions you have.  Whether you are pregnant or may be pregnant. What are the risks? Generally, this is a safe test. However, problems may occur, including an allergic reaction to dye (contrast) that may be used during the test. What happens before the test? No specific preparation is needed. You may eat and drink normally. What happens during the  test?  You will take off your clothes from the waist up and put on a hospital gown.  Electrodes or electrocardiogram (ECG)patches may be placed on your chest. The electrodes or patches are then connected to a device that monitors your heart rate and rhythm.  You will lie down on a table for an ultrasound exam. A gel will be applied to your chest to help sound waves pass through your skin.  A handheld device, called a transducer, will be pressed against your chest and moved over your heart. The transducer produces sound waves that travel to your heart and bounce back (or "echo" back) to the transducer. These sound waves will be captured in real-time and changed into images of your heart that can be viewed on a video monitor. The images will be recorded on a computer and reviewed by your health care provider.  You may be asked to change positions or hold your breath for a short time. This makes it easier to get different views or better views of your heart.  In some cases, you may receive contrast through an IV in one of your veins. This can improve the quality of the pictures from your heart. The procedure may vary among health care providers and hospitals.   What can I expect after the test? You may return to your normal, everyday life, including diet, activities, and medicines, unless your health care provider tells you not to do that. Follow these instructions at home:  It is up to you to get the results of your test. Ask your health care provider, or the department that is doing the test, when your results will be ready.  Keep all follow-up visits. This is important. Summary  An echocardiogram is a test that uses sound waves (ultrasound) to produce images of the heart.  Images from an echocardiogram can provide important information about the size and shape of your heart, heart muscle function, heart valve function, and other possible heart problems.  You do not need to do anything to  prepare before this test. You may eat and drink normally.  After the echocardiogram is completed, you may return to your normal, everyday life, unless your health care provider tells you not to do that. This information is not intended to replace advice given to you by your health care provider. Make sure you discuss any questions you have with your health care provider. Document Revised: 08/20/2019 Document Reviewed: 08/20/2019 Elsevier Patient Education  2021 [employer].

## 2020-05-30 ENCOUNTER — Other Ambulatory Visit: Payer: Self-pay | Admitting: Pulmonary Disease

## 2020-06-22 ENCOUNTER — Other Ambulatory Visit (HOSPITAL_COMMUNITY): Payer: BC Managed Care – PPO

## 2020-06-24 MED ORDER — FOLIC ACID 1 MG PO TABS: 1.0000 mg | ORAL_TABLET | Freq: Every day | ORAL | 5 refills | Status: AC

## 2020-06-24 MED ORDER — BUDESONIDE-FORMOTEROL FUMARATE 160-4.5 MCG/ACT IN AERO
2.0000 | INHALATION_SPRAY | Freq: Two times a day (BID) | RESPIRATORY_TRACT | 3 refills | Status: AC
Start: 2020-06-24 — End: ?

## 2021-03-25 IMAGING — CT CT CHEST HIGH RESOLUTION W/O CM
2 of 7 series · 14 of 36 positions shown, 17 images · non-contrast
Comparison: 07/05/2017

CLINICAL DATA: Sarcoid

EXAM:
CT CHEST WITHOUT CONTRAST
TECHNIQUE: Multidetector CT imaging of the chest was performed following the
standard protocol without intravenous contrast. High resolution
imaging of the lungs, as well as inspiratory and expiratory imaging,
was performed.

[Series 4: high resolution · axial · 0.70mm/px · z∈[+994,+1252]mm · 11 of 309 slices shown, 14 images]
[im 26/309  mediastinal]
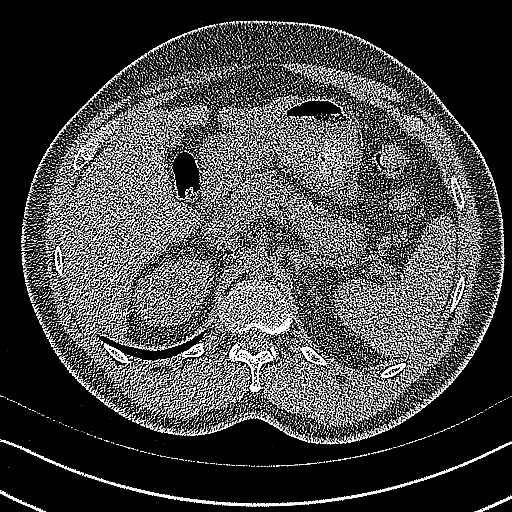
[im 26/309  lung]
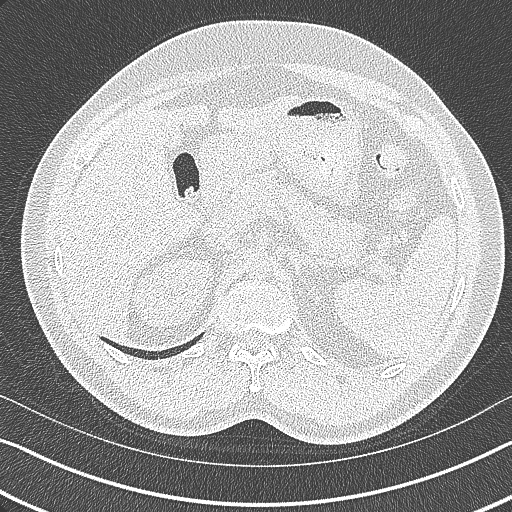
[im 52/309  lung]
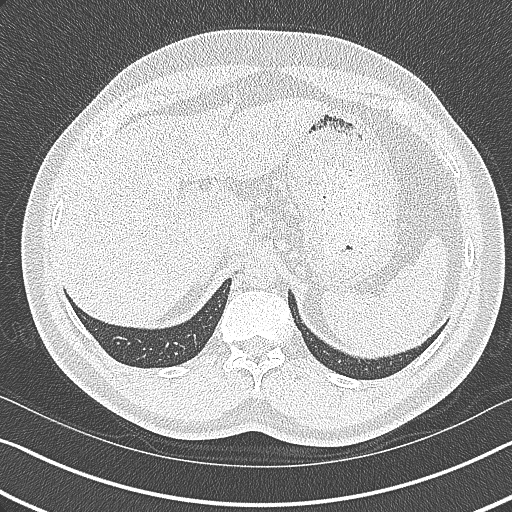
[im 78/309  lung]
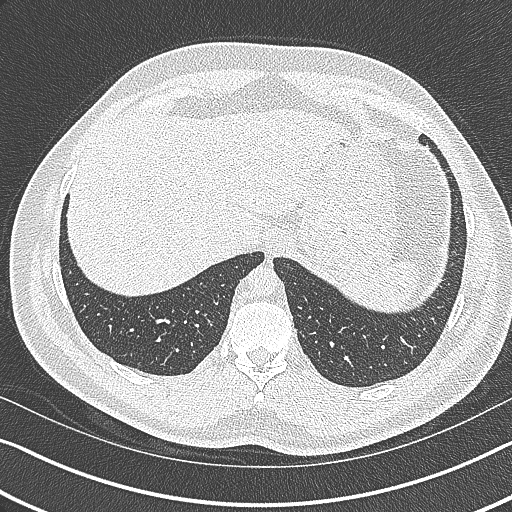
[im 103/309  lung]
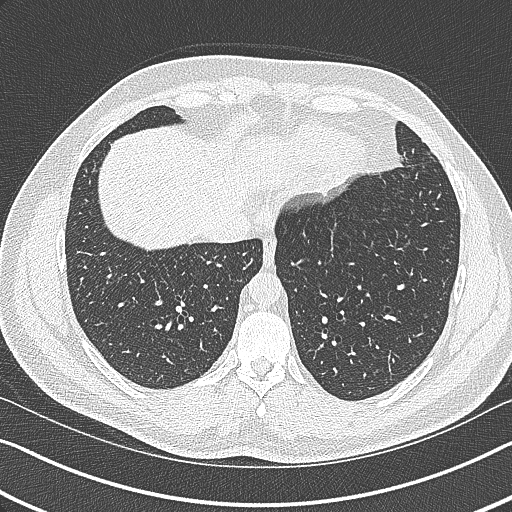
[im 129/309  mediastinal]
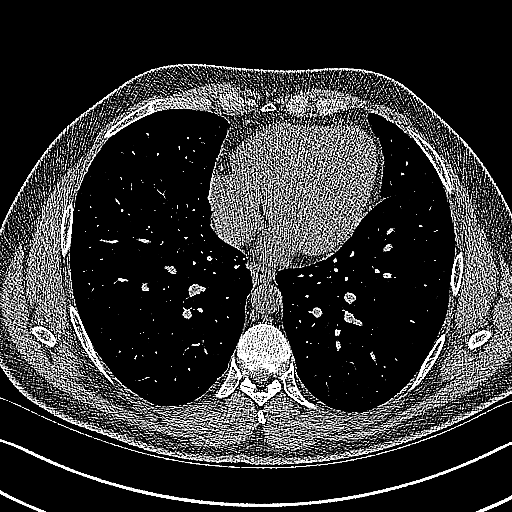
[im 129/309  lung]
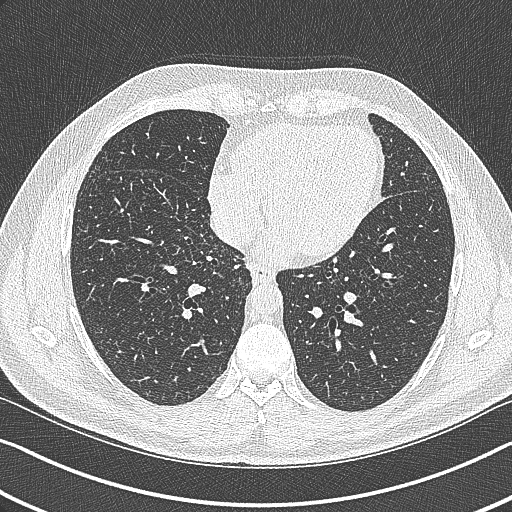
[im 155/309  lung]
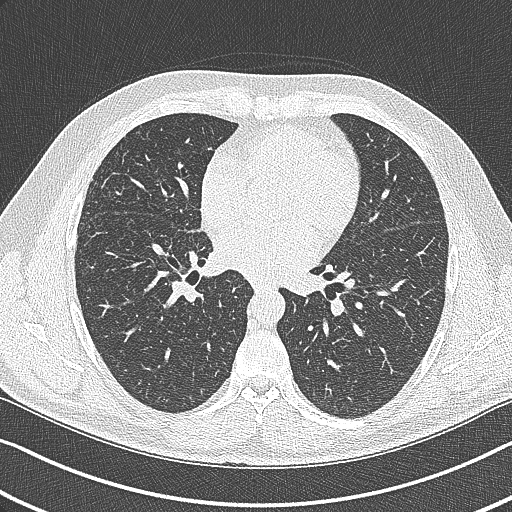
[im 180/309  lung]
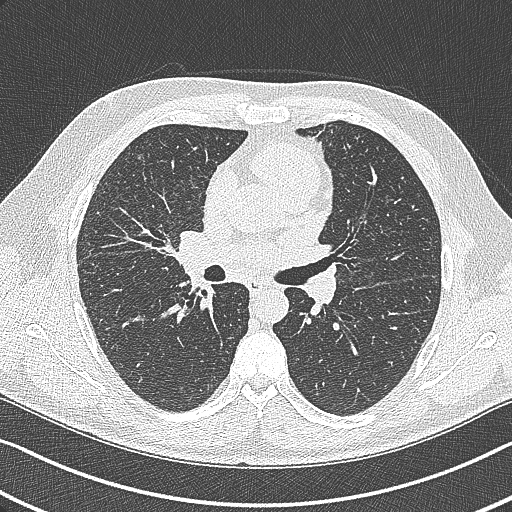
[im 206/309  lung]
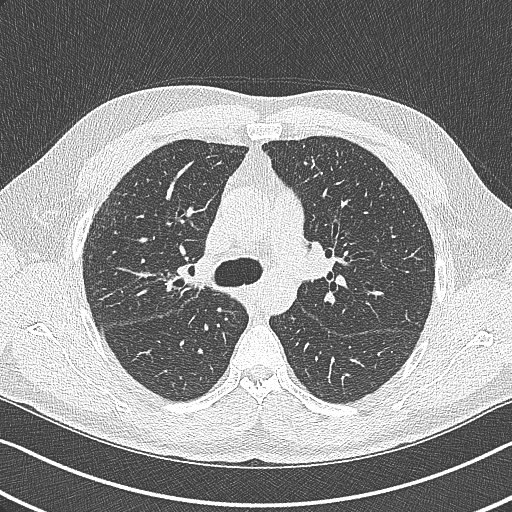
[im 232/309  mediastinal]
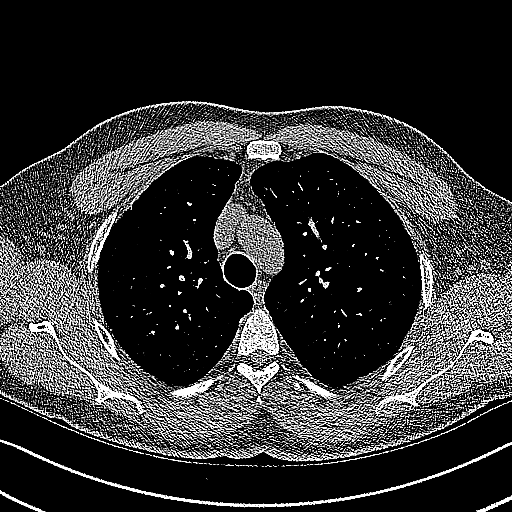
[im 232/309  lung]
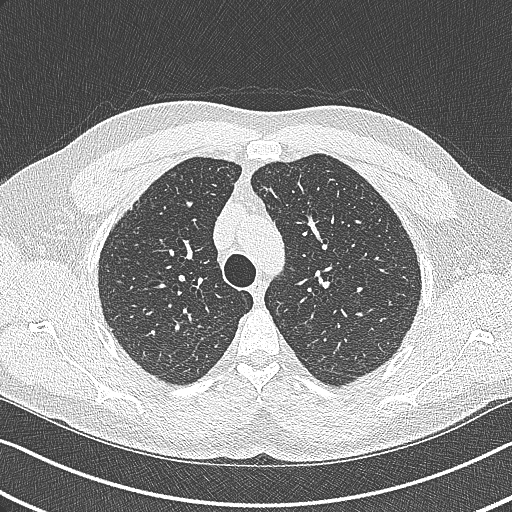
[im 257/309  lung]
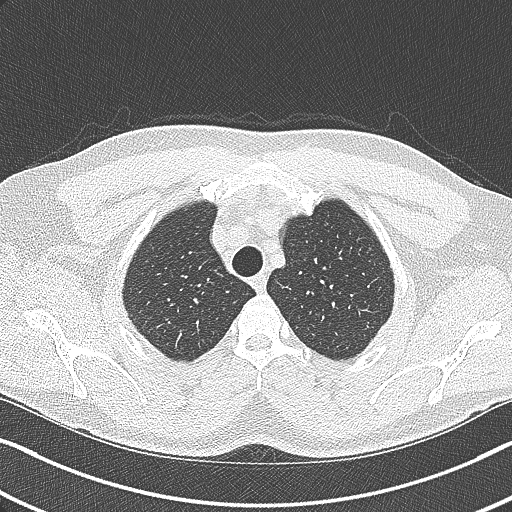
[im 283/309  lung]
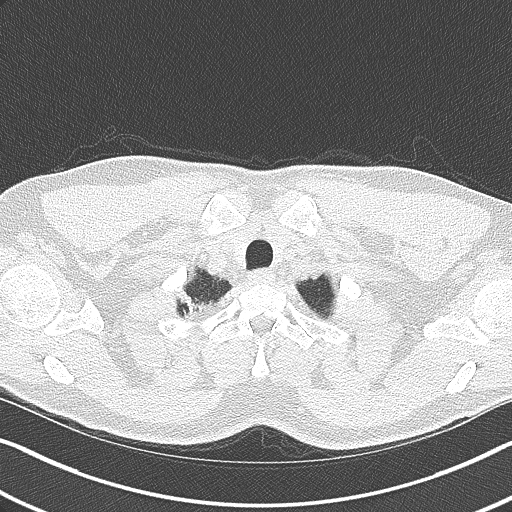

[Series 6: coronal · coronal · 0.62mm/px · 3 of 133 slices shown]
[im 27/133  lung]
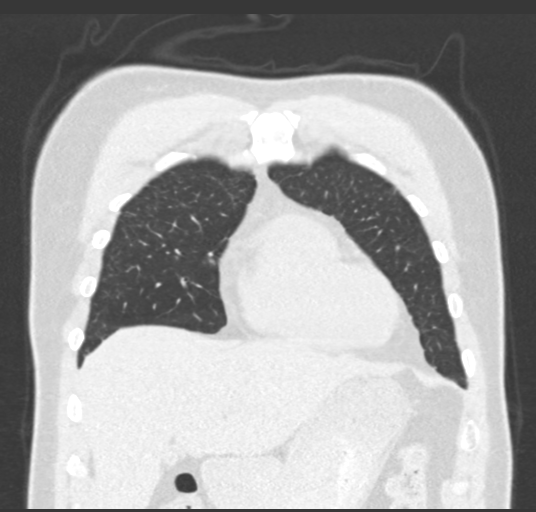
[im 53/133  lung]
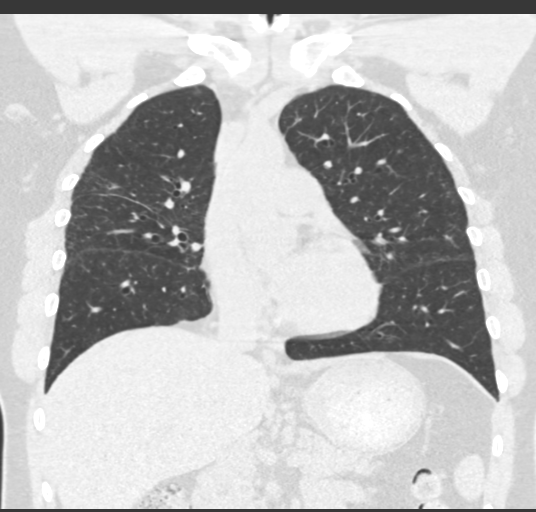
[im 80/133  lung]
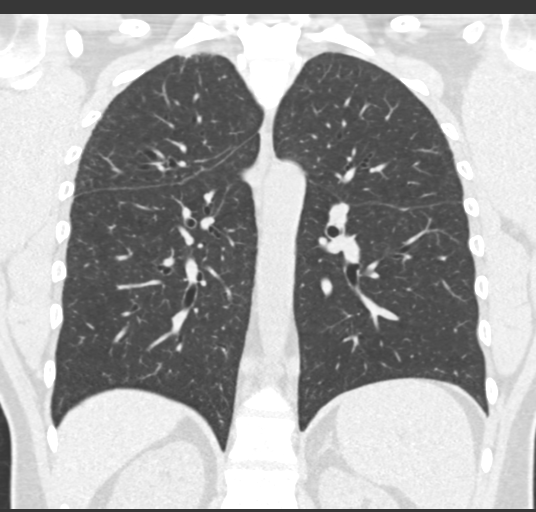

[14 of 36 positions shown; findings below may reference images not displayed]

FINDINGS: Cardiovascular: No significant vascular findings. Normal heart size.
No pericardial effusion.

Mediastinum/Nodes: No enlarged mediastinal, hilar, or axillary lymph
nodes. Thyroid gland, trachea, and esophagus demonstrate no
significant findings.

Lungs/Pleura: Unchanged extremely fine nodularity concentrated in a
perilymphatic distribution, particularly notable along the fissures
(series 4, image 108). There is some architectural distortion and
bronchiectasis, particularly of the right midlung. Mild lobular
trapping on expiratory phase imaging. Pleural effusion or
pneumothorax.

Upper Abdomen: No acute abnormality.

Musculoskeletal: No chest wall mass or suspicious bone lesions
identified.
IMPRESSION: 1. Unchanged extremely fine nodularity concentrated in a
perilymphatic distribution, particularly notable along the fissures.
There is some architectural distortion and bronchiectasis,
particularly of the right midlung, likewise unchanged. Findings are
generally in keeping with reported diagnosis of sarcoidosis.

2. Lobular air trapping on expiratory phase imaging, suggestive of
small airways disease.
